# Patient Record
Sex: Male | Born: 1935 | Race: Black or African American | Hispanic: No | Smoking: Former smoker
Health system: Southern US, Community
[De-identification: ages and names within clinical notes are randomized; demographics above are authoritative.]

## PROBLEM LIST (undated history)

## (undated) DIAGNOSIS — R69 Illness, unspecified: Secondary | ICD-10-CM

## (undated) DIAGNOSIS — M8500 Fibrous dysplasia (monostotic), unspecified site: Secondary | ICD-10-CM

## (undated) DIAGNOSIS — I4891 Unspecified atrial fibrillation: Secondary | ICD-10-CM

## (undated) DIAGNOSIS — R0602 Shortness of breath: Secondary | ICD-10-CM

## (undated) DIAGNOSIS — C61 Malignant neoplasm of prostate: Secondary | ICD-10-CM

## (undated) DIAGNOSIS — D472 Monoclonal gammopathy: Secondary | ICD-10-CM

## (undated) DIAGNOSIS — D509 Iron deficiency anemia, unspecified: Secondary | ICD-10-CM

## (undated) DIAGNOSIS — I071 Rheumatic tricuspid insufficiency: Secondary | ICD-10-CM

## (undated) DIAGNOSIS — I5032 Chronic diastolic (congestive) heart failure: Secondary | ICD-10-CM

## (undated) DIAGNOSIS — J439 Emphysema, unspecified: Secondary | ICD-10-CM

## (undated) HISTORY — DX: Illness, unspecified: R69

## (undated) HISTORY — DX: Rheumatic tricuspid insufficiency: I07.1

## (undated) HISTORY — PX: OTHER SURGICAL HISTORY: SHX169

## (undated) HISTORY — DX: Emphysema, unspecified: J43.9

## (undated) HISTORY — DX: Malignant neoplasm of prostate: C61

## (undated) HISTORY — DX: Monoclonal gammopathy: D47.2

## (undated) HISTORY — DX: Fibrous dysplasia (monostotic), unspecified site: M85.00

## (undated) HISTORY — PX: FINGER AMPUTATION: SHX636

## (undated) HISTORY — DX: Iron deficiency anemia, unspecified: D50.9

## (undated) HISTORY — DX: Chronic diastolic (congestive) heart failure: I50.32

## (undated) HISTORY — DX: Unspecified atrial fibrillation: I48.91

---

## 2006-06-17 ENCOUNTER — Emergency Department (HOSPITAL_COMMUNITY): Admission: EM | Admit: 2006-06-17 | Discharge: 2006-06-17 | Payer: Self-pay | Admitting: Emergency Medicine

## 2006-06-18 ENCOUNTER — Inpatient Hospital Stay (HOSPITAL_COMMUNITY): Admission: EM | Admit: 2006-06-18 | Discharge: 2006-06-24 | Payer: Self-pay | Admitting: Emergency Medicine

## 2006-06-23 ENCOUNTER — Encounter: Payer: Self-pay | Admitting: General Surgery

## 2009-01-28 ENCOUNTER — Emergency Department (HOSPITAL_COMMUNITY): Admission: EM | Admit: 2009-01-28 | Discharge: 2009-01-28 | Payer: Self-pay | Admitting: Family Medicine

## 2009-02-05 ENCOUNTER — Emergency Department (HOSPITAL_COMMUNITY): Admission: EM | Admit: 2009-02-05 | Discharge: 2009-02-05 | Payer: Self-pay | Admitting: Family Medicine

## 2009-02-07 ENCOUNTER — Ambulatory Visit (HOSPITAL_COMMUNITY): Admission: RE | Admit: 2009-02-07 | Discharge: 2009-02-07 | Payer: Self-pay | Admitting: Orthopedic Surgery

## 2009-09-10 ENCOUNTER — Encounter: Payer: Self-pay | Admitting: Internal Medicine

## 2009-11-05 ENCOUNTER — Encounter: Payer: Self-pay | Admitting: Internal Medicine

## 2009-11-07 ENCOUNTER — Encounter: Payer: Self-pay | Admitting: Internal Medicine

## 2010-04-29 ENCOUNTER — Emergency Department (HOSPITAL_COMMUNITY): Payer: Medicare Other

## 2010-04-29 ENCOUNTER — Inpatient Hospital Stay (HOSPITAL_COMMUNITY)
Admission: EM | Admit: 2010-04-29 | Discharge: 2010-05-04 | DRG: 189 | Disposition: A | Discharge status: Skilled Nursing Facility | Payer: Medicare Other | Attending: Internal Medicine | Admitting: Internal Medicine

## 2010-04-29 DIAGNOSIS — Z23 Encounter for immunization: Secondary | ICD-10-CM

## 2010-04-29 DIAGNOSIS — I509 Heart failure, unspecified: Secondary | ICD-10-CM | POA: Diagnosis present

## 2010-04-29 DIAGNOSIS — Z87891 Personal history of nicotine dependence: Secondary | ICD-10-CM

## 2010-04-29 DIAGNOSIS — M810 Age-related osteoporosis without current pathological fracture: Secondary | ICD-10-CM | POA: Diagnosis present

## 2010-04-29 DIAGNOSIS — J4489 Other specified chronic obstructive pulmonary disease: Secondary | ICD-10-CM | POA: Diagnosis present

## 2010-04-29 DIAGNOSIS — D509 Iron deficiency anemia, unspecified: Secondary | ICD-10-CM | POA: Diagnosis present

## 2010-04-29 DIAGNOSIS — I2789 Other specified pulmonary heart diseases: Secondary | ICD-10-CM | POA: Diagnosis present

## 2010-04-29 DIAGNOSIS — I5030 Unspecified diastolic (congestive) heart failure: Secondary | ICD-10-CM | POA: Diagnosis present

## 2010-04-29 DIAGNOSIS — D696 Thrombocytopenia, unspecified: Secondary | ICD-10-CM | POA: Diagnosis present

## 2010-04-29 DIAGNOSIS — I080 Rheumatic disorders of both mitral and aortic valves: Secondary | ICD-10-CM | POA: Diagnosis present

## 2010-04-29 DIAGNOSIS — J449 Chronic obstructive pulmonary disease, unspecified: Secondary | ICD-10-CM | POA: Diagnosis present

## 2010-04-29 DIAGNOSIS — Z9981 Dependence on supplemental oxygen: Secondary | ICD-10-CM

## 2010-04-29 DIAGNOSIS — R627 Adult failure to thrive: Secondary | ICD-10-CM | POA: Diagnosis present

## 2010-04-29 DIAGNOSIS — J189 Pneumonia, unspecified organism: Secondary | ICD-10-CM | POA: Diagnosis present

## 2010-04-29 DIAGNOSIS — N400 Enlarged prostate without lower urinary tract symptoms: Secondary | ICD-10-CM | POA: Diagnosis present

## 2010-04-29 DIAGNOSIS — J9620 Acute and chronic respiratory failure, unspecified whether with hypoxia or hypercapnia: Principal | ICD-10-CM | POA: Diagnosis present

## 2010-04-29 DIAGNOSIS — I959 Hypotension, unspecified: Secondary | ICD-10-CM | POA: Diagnosis present

## 2010-04-29 LAB — CBC
HCT: 27.3 % — ABNORMAL LOW (ref 39.0–52.0)
Hemoglobin: 8.5 g/dL — ABNORMAL LOW (ref 13.0–17.0)
MCH: 24.1 pg — ABNORMAL LOW (ref 26.0–34.0)
MCHC: 31.1 g/dL (ref 30.0–36.0)
MCV: 77.6 fL — ABNORMAL LOW (ref 78.0–100.0)
Platelets: 120 10*3/uL — ABNORMAL LOW (ref 150–400)
RBC: 3.52 MIL/uL — ABNORMAL LOW (ref 4.22–5.81)
RDW: 20 % — ABNORMAL HIGH (ref 11.5–15.5)
WBC: 6 10*3/uL (ref 4.0–10.5)

## 2010-04-29 LAB — TSH: TSH: 2.148 u[IU]/mL (ref 0.350–4.500)

## 2010-04-29 LAB — POCT I-STAT, CHEM 8
BUN: 22 mg/dL (ref 6–23)
Creatinine, Ser: 1.2 mg/dL (ref 0.4–1.5)
Glucose, Bld: 86 mg/dL (ref 70–99)
Hemoglobin: 9.5 g/dL — ABNORMAL LOW (ref 13.0–17.0)
Sodium: 144 mEq/L (ref 135–145)
TCO2: 24 mmol/L (ref 0–100)

## 2010-04-29 LAB — DIFFERENTIAL
Lymphocytes Relative: 15 % (ref 12–46)
Lymphs Abs: 0.9 10*3/uL (ref 0.7–4.0)
Monocytes Absolute: 0.3 10*3/uL (ref 0.1–1.0)
Monocytes Relative: 5 % (ref 3–12)
Neutro Abs: 4.7 10*3/uL (ref 1.7–7.7)
Neutrophils Relative %: 78 % — ABNORMAL HIGH (ref 43–77)

## 2010-04-29 LAB — HEPATIC FUNCTION PANEL
ALT: 22 U/L (ref 0–53)
AST: 22 U/L (ref 0–37)
Albumin: 3.3 g/dL — ABNORMAL LOW (ref 3.5–5.2)
Total Protein: 6.7 g/dL (ref 6.0–8.3)

## 2010-04-29 LAB — IRON AND TIBC
Saturation Ratios: 10 % — ABNORMAL LOW (ref 20–55)
UIBC: 167 ug/dL

## 2010-04-29 LAB — POCT CARDIAC MARKERS

## 2010-04-29 LAB — T4, FREE: Free T4: 1.2 ng/dL (ref 0.80–1.80)

## 2010-04-29 LAB — VITAMIN B12: Vitamin B-12: 497 pg/mL (ref 211–911)

## 2010-04-29 LAB — PROTIME-INR: Prothrombin Time: 15.7 seconds — ABNORMAL HIGH (ref 11.6–15.2)

## 2010-04-29 LAB — BRAIN NATRIURETIC PEPTIDE: Pro B Natriuretic peptide (BNP): 206 pg/mL — ABNORMAL HIGH (ref 0.0–100.0)

## 2010-04-29 MED ORDER — IOHEXOL 350 MG/ML SOLN: 100.0000 mL | Freq: Once | INTRAVENOUS | Status: AC | PRN

## 2010-04-30 DIAGNOSIS — I319 Disease of pericardium, unspecified: Secondary | ICD-10-CM

## 2010-04-30 LAB — BASIC METABOLIC PANEL
BUN: 18 mg/dL (ref 6–23)
CO2: 27 mEq/L (ref 19–32)
Calcium: 9.2 mg/dL (ref 8.4–10.5)
GFR calc non Af Amer: >60 mL/min (ref >60)
Glucose, Bld: 94 mg/dL (ref 70–99)

## 2010-04-30 LAB — CBC
HCT: 26.9 % — ABNORMAL LOW (ref 39.0–52.0)
MCHC: 31.2 g/dL (ref 30.0–36.0)
MCV: 78.2 fL (ref 78.0–100.0)
RDW: 20.1 % — ABNORMAL HIGH (ref 11.5–15.5)

## 2010-04-30 LAB — LIPID PANEL
HDL: 28 mg/dL — ABNORMAL LOW (ref >39)
VLDL: 10 mg/dL (ref 0–40)

## 2010-04-30 LAB — MAGNESIUM: Magnesium: 1.9 mg/dL (ref 1.5–2.5)

## 2010-05-01 ENCOUNTER — Inpatient Hospital Stay (HOSPITAL_COMMUNITY): Payer: Medicare Other

## 2010-05-01 LAB — CBC
HCT: 25.4 % — ABNORMAL LOW (ref 39.0–52.0)
Hemoglobin: 8 g/dL — ABNORMAL LOW (ref 13.0–17.0)
RDW: 19.7 % — ABNORMAL HIGH (ref 11.5–15.5)
WBC: 6.5 10*3/uL (ref 4.0–10.5)

## 2010-05-01 LAB — BRAIN NATRIURETIC PEPTIDE: Pro B Natriuretic peptide (BNP): 220 pg/mL — ABNORMAL HIGH (ref 0.0–100.0)

## 2010-05-01 LAB — BASIC METABOLIC PANEL
Calcium: 8.8 mg/dL (ref 8.4–10.5)
GFR calc Af Amer: >60 mL/min (ref >60)
GFR calc non Af Amer: >60 mL/min (ref >60)
Glucose, Bld: 81 mg/dL (ref 70–99)
Potassium: 3.8 mEq/L (ref 3.5–5.1)
Sodium: 140 mEq/L (ref 135–145)

## 2010-05-01 LAB — VITAMIN D 1,25 DIHYDROXY
Vitamin D 1, 25 (OH)2 Total: 60 pg/mL (ref 18–72)
Vitamin D2 1, 25 (OH)2: <8 pg/mL

## 2010-05-02 LAB — BASIC METABOLIC PANEL
BUN: 19 mg/dL (ref 6–23)
CO2: 30 mEq/L (ref 19–32)
Calcium: 8.6 mg/dL (ref 8.4–10.5)
Creatinine, Ser: 0.94 mg/dL (ref 0.4–1.5)
GFR calc non Af Amer: >60 mL/min (ref >60)
Glucose, Bld: 83 mg/dL (ref 70–99)
Sodium: 140 mEq/L (ref 135–145)

## 2010-05-03 LAB — BASIC METABOLIC PANEL
CO2: 31 mEq/L (ref 19–32)
Calcium: 8.6 mg/dL (ref 8.4–10.5)
Chloride: 104 mEq/L (ref 96–112)
Creatinine, Ser: 1.04 mg/dL (ref 0.4–1.5)
GFR calc Af Amer: >60 mL/min (ref >60)
Sodium: 141 mEq/L (ref 135–145)

## 2010-05-03 LAB — CBC
MCH: 24.2 pg — ABNORMAL LOW (ref 26.0–34.0)
MCHC: 31.5 g/dL (ref 30.0–36.0)
Platelets: 120 10*3/uL — ABNORMAL LOW (ref 150–400)
RDW: 19.5 % — ABNORMAL HIGH (ref 11.5–15.5)

## 2010-05-04 LAB — BASIC METABOLIC PANEL
CO2: 30 mEq/L (ref 19–32)
Calcium: 8.7 mg/dL (ref 8.4–10.5)
GFR calc Af Amer: >60 mL/min (ref >60)
GFR calc non Af Amer: >60 mL/min (ref >60)
Potassium: 4.1 mEq/L (ref 3.5–5.1)
Sodium: 140 mEq/L (ref 135–145)

## 2010-05-05 LAB — IGG, IGA, IGM
IgG (Immunoglobin G), Serum: 1370 mg/dL (ref 694–1618)
IgM, Serum: 8 mg/dL — ABNORMAL LOW (ref 60–263)

## 2010-05-05 LAB — PROTEIN ELECTROPH W RFLX QUANT IMMUNOGLOBULINS
Albumin ELP: 47.8 % — ABNORMAL LOW (ref 55.8–66.1)
M-Spike, %: 0.48 g/dL
Total Protein ELP: 6.5 g/dL (ref 6.0–8.3)

## 2010-05-05 NOTE — Discharge Summary (Signed)
NAMEQUINLIN, CONANT               ACCOUNT NO.:  [id number]  MEDICAL RECORD NO.:  [id number]           PATIENT TYPE:  I  LOCATION:  3728                         FACILITY:  [facility]  PHYSICIAN:  Andreas Blower, MD       DATE OF BIRTH:  01/04/1927  DATE OF ADMISSION:  04/29/2010 DATE OF DISCHARGE:                        DISCHARGE SUMMARY - REFERRING   PRIMARY CARE PHYSICIAN:  Dr. Dorene Grebe at [place], [place]  DISCHARGE DIAGNOSES: 1. Acute-on-chronic renal failure. 2. Diastolic heart failure. 3. Pulmonary hypertension. 4. Questionable pneumonia noted on x-ray. 5. Chronic anemia most likely due to iron deficiency. 6. Benign prostatic hypertrophy. 7. History of traumatic pneumothorax. 8. 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 9. Hypertension. 10.History of osteoporosis. 11.Thrombocytopenia.  DISCHARGE MEDICATIONS: 1. Ferrous sulfate 325 mg p.o. daily. 2. Furosemide 40 mg p.o. twice daily. 3. Moxifloxacin 400 mg p.o. daily. 4. Advair 1 puff inhaled twice daily. 5. Combivent 1-2 puffs inhaled every 4-6 hours as needed for shortness     of breath. 6. Finasteride 5 mg p.o. daily. 7. Alendronate 5 mg p.o. q. week.  BRIEF ADMITTING HISTORY AND PHYSICAL:  [name] is an 77 year old African American gentleman who was brought into the ER with shortness of breath that he has been having worsening over the past 2 months.  RADIOLOGY/IMAGING:  The patient had a portable chest x-ray which showed cardiomegaly, pulmonary vascular congestion and probable mild interstitial pulmonary edema, right lower lobe airspace disease, question focal edema versus superimposed pneumonia.  The patient had CT of the chest with contrast which showed no evidence of pulmonary embolism, diffuse ground-glass attenuation, cardiomegaly and bilateral pleural effusions suggest congestive heart failure.  Diffuse expansile lytic lesions throughout  thoracic spine and ribs.  The patient had chest x-ray two view on May 01, 2010, which showed improvement in right lower lobe airspace disease, emphysematous changes noted.  The patient had a 2-D echocardiogram on April 30, 2010, which showed the patient's cavity size was normal.  Wall thickness was increased in the pattern of mild LVH.  Ejection fraction was 55%.  Doppler parameters are consistent with high ventricular filling pressures.  Ventricular septum showed paradox.  Septal motion showed paradox.  Aortic valve showed mild calcified leaflet.  Mitral valve showed mild regurgitation. Left atrium was mildly dilated, right atrium was mildly dilated, tricuspid valve showed moderate regurgitation.  Pulmonary artery systolic pressure was moderately increased with PA peak pressure of 56 mmHg.  LABORATORY DATA:  CBC shows a white count of 5.8, hemoglobin 8.0, hematocrit 25.4, platelet count 120.  Electrolytes normal with creatinine of 0.97.  BNP initially was 206, LDL is 57.  TSH was 2.148, free T4 is 1.2.  Serum iron 18, TIBC 185, percent saturation 100, UIBC 167.  Vitamin B12 497, serum folate 11.4, ferritin 507.  Vitamin D 25- hydroxy was 44, vitamin D 1,25 hydroxy was 60.  HOSPITAL COURSE BY PROBLEM: 1. Acute on chronic respiratory failure multifactorial, likely     secondary to pulmonary hypertension, diastolic heart failure and     questionable pneumonia  improved during the course of hospital stay. 2. Diastolic heart failure.  The patient had his Lasix increased from     20 mg p.o. daily that he takes at home to 20 mg IV b.i.d.  During     the course of hospital stay, the patient had good diuresis.  He had     his weight decreased from 67.5 kg to 61.3 kg at the time of     discharge.  His Lasix was transitioned to 40 mg p.o. twice daily.     The patient will need to have his electrolytes checked on May 08, 2010 to see if his Lasix dose needs to be adjusted.  We  will     defer to his primary care physician to follow up on the labs. 3. Pulmonary hypertension.  The patient had elevated PA pressures as     noted on echocardiogram, consider getting an outpatient pulmonary     evaluation.  We will defer to his primary care physician. 4. Iron-deficiency anemia.  The patient had anemia panel which     showed iron-deficiency anemia.  The patient was started on     supplemental iron. 5. 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  The     patient reports that he has had extensive workup in the past.  He     was evaluated by Dr. Abbe Amsterdam with oncology at [facility] and had     a subsequent referral to [facility] and also has had a bone biopsy     done. 6. Hypotension secondary to diuresis, resolved during the course of     hospital stay. 7. Thrombocytopenia appears to be chronic in nature.  Platelets     remained stable during the course of hospital stay.  Time spent on discharge talking to the patient coordinating care was 35 minutes.     Andreas Blower, MD     SR/MEDQ  D:  05/04/2010  T:  05/04/2010  Job:  [id number]  cc:   Dr. Dorene Grebe  Electronically Signed by Wardell Heath Linnell Swords  on 05/05/2010 10:36:31 PM

## 2010-05-07 LAB — IMMUNOFIXATION ADD-ON
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
## 2010-06-09 ENCOUNTER — Encounter: Payer: Self-pay | Admitting: Internal Medicine

## 2010-06-19 ENCOUNTER — Telehealth: Payer: Self-pay | Admitting: Internal Medicine

## 2010-06-19 NOTE — Telephone Encounter (Signed)
Forwarded to Dr. Norins for review. °

## 2010-06-24 LAB — ANAEROBIC CULTURE

## 2010-06-24 LAB — CULTURE, ROUTINE-ABSCESS

## 2010-06-24 LAB — BASIC METABOLIC PANEL
BUN: 17 mg/dL (ref 6–23)
Calcium: 9 mg/dL (ref 8.4–10.5)
Creatinine, Ser: 1.17 mg/dL (ref 0.4–1.5)
GFR calc Af Amer: >60 mL/min (ref >60)
GFR calc non Af Amer: >60 mL/min (ref >60)

## 2010-06-24 LAB — TISSUE CULTURE

## 2010-06-24 LAB — CBC
Platelets: 161 10*3/uL (ref 150–400)
WBC: 5.6 10*3/uL (ref 4.0–10.5)

## 2010-07-08 ENCOUNTER — Ambulatory Visit (INDEPENDENT_AMBULATORY_CARE_PROVIDER_SITE_OTHER): Payer: PRIVATE HEALTH INSURANCE | Admitting: Internal Medicine

## 2010-07-08 ENCOUNTER — Encounter: Payer: Self-pay | Admitting: Internal Medicine

## 2010-07-08 DIAGNOSIS — I4891 Unspecified atrial fibrillation: Secondary | ICD-10-CM | POA: Insufficient documentation

## 2010-07-08 DIAGNOSIS — I509 Heart failure, unspecified: Secondary | ICD-10-CM

## 2010-07-08 DIAGNOSIS — I5032 Chronic diastolic (congestive) heart failure: Secondary | ICD-10-CM

## 2010-07-08 DIAGNOSIS — J449 Chronic obstructive pulmonary disease, unspecified: Secondary | ICD-10-CM

## 2010-07-08 NOTE — Progress Notes (Signed)
HPI: [name] is a 77 y.o. male Seen to establish cardiac care.  He will was seen by his internist and placed in [place] and found in March to have atrial fibrillation with a rapid ventricular response. He was admitted there. He apparently converted on his own. He was treated with low-dose Cardizem for rate control as he had modest hypotension. He was treated with aspirin for thromboembolic risk reduction. Notably he was not aware of his tachycardia when he was seen by his physician. He had only modest accompanying shortness of breath.  About a month before that he had been admitted to [facility] for significant dyspnea on exertion.  The discharge summary is not available the H&P suggested that he had heart failure as the cause. He was treated with a diuretic improved and was discharged.  Echo cardiogram done at [facility] in March demonstrated normal left ventricular function moderate pulmonary hypertension and known tricuspid and mitral regurgitation.  Thromboembolic risk factors are noted for age-58 hypertension-one  His major complaints today are related to new urinating at night. Functionally he is back to his baseline. He denied chest pain palpitations. His peripheral edema has also resolved. Current Outpatient Prescriptions  Medication Sig Dispense Refill  . albuterol-ipratropium (COMBIVENT) 18-103 MCG/ACT inhaler Inhale 2 puffs into the lungs every 6 (six) hours as needed.        [name] alendronate (FOSAMAX) 70 MG tablet Take 70 mg by mouth every 7 (seven) days. Take with a full glass of water on an empty stomach.       [name] aspirin 81 MG tablet Take 81 mg by mouth daily.        . Calcium Carbonate-Vitamin D (CALCIUM 600+D) 600-400 MG-UNIT per tablet Take 1 tablet by mouth daily.        . Cholecalciferol (VITAMIN D3) 1000 UNITS CAPS Take 1 capsule by mouth daily.        . cyanocobalamin (,VITAMIN B-12,) 1000 MCG/ML injection Inject 1,000 mcg into the muscle once.        . diltiazem (CARDIZEM)  30 MG tablet Take 30 mg by mouth 3 (three) times daily.        . finasteride (PROSCAR) 5 MG tablet Take 5 mg by mouth daily.        . fluticasone-salmeterol (ADVAIR HFA) 115-21 MCG/ACT inhaler Inhale 2 puffs into the lungs 2 (two) times daily.        . furosemide (LASIX) 40 MG tablet Take 40 mg by mouth 2 (two) times daily.        . Tamsulosin HCl (FLOMAX) 0.4 MG CAPS Take 0.4 mg by mouth daily.          No Known Allergies  Past Medical History  Diagnosis Date  . Atrial fibrillation   . COPD (chronic obstructive pulmonary disease)   . Heart failure, diastolic, chronic     EF 55-60% echo 2012  . Diagnosis unknown     lytic lesions-spine-biopsy negative?  . Prostate cancer   . Tricuspid regurgitation     pulmonary hypertension and mitral regurgitation  . Anemia, iron deficiency     History reviewed. No pertinent past surgical history.  Family History  Problem Relation Age of Onset  . Heart attack Father   . Cancer Daughter     History   Social History  . Marital Status: Single    Spouse Name: N/A    Number of Children: N/A  . Years of Education: N/A   Occupational History  .  Not on file.   Social History Main Topics  . Smoking status: Former Smoker    Types: Cigarettes    Quit date: 07/08/2003  . Smokeless tobacco: Never Used  . Alcohol Use: No  . Drug Use: Not on file  . Sexually Active: Not on file   Other Topics Concern  . 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   Cachectic appearing elderly African American male appearing his stated age chewing on a toothpick in  no acute distress HENT normal apart from poor dentition Neck supple with JVP-flat Carotids brisk and full without  bruits Back without scoliosis or kyphosis;Mark barrel chest abnormality Mostly Clear occasional crackles Regular rate and rhythm quite distant sounds  Abd-soft with active BS without hepatomegaly or midline pulsation Femoral pulses 2+ distal pulses intact No Clubbing cyanosis edema Skin-warm and dry LN-neg submandibular and supraclavicular A & Oriented CN 3-12 normal  Grossly normal sensory and motor function Affect engaging . Electrocardiogram demonstrates sinus rhythm at 81 at intervals 0.17/0.11/0.39 Incomplete right bundle branch block with left axis deviation Left ventricular hypertrophy

## 2010-07-08 NOTE — Patient Instructions (Signed)
Your physician recommends that you schedule a follow-up appointment in: 6 MONTHS WITH DR Graciela Husbands   Your physician has recommended you make the following change in your medication:  DECREASE FUROSEMIDE TO 1 TAB DAILY

## 2010-07-08 NOTE — Assessment & Plan Note (Signed)
Patient is much improved. We'll decrease his diuretic from twice daily to once a day.

## 2010-07-08 NOTE — Assessment & Plan Note (Signed)
The patient likely has paroxysmal atrial fibrillation and it will recur. Because he does not feel it, I have instructed him how to take his pulse. He is to let us know in the event that he has recurrence of his atrial fibrillation. I suspect that the diltiazem will be an adequate for rate control. However, right now his blood pressure is borderline and so we will leave it the way it is.   There were reasons it was elected not to put him on Coumadin. I don't know if it's related to his iron deficiency anemia or the lytic lesions in his spine. We'll continue him right now on aspirin.
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
## 2010-08-11 ENCOUNTER — Other Ambulatory Visit (INDEPENDENT_AMBULATORY_CARE_PROVIDER_SITE_OTHER): Payer: PRIVATE HEALTH INSURANCE

## 2010-08-11 ENCOUNTER — Ambulatory Visit (INDEPENDENT_AMBULATORY_CARE_PROVIDER_SITE_OTHER): Payer: PRIVATE HEALTH INSURANCE | Admitting: Internal Medicine

## 2010-08-11 ENCOUNTER — Encounter: Payer: Self-pay | Admitting: Internal Medicine

## 2010-08-11 DIAGNOSIS — D649 Anemia, unspecified: Secondary | ICD-10-CM

## 2010-08-11 DIAGNOSIS — I10 Essential (primary) hypertension: Secondary | ICD-10-CM

## 2010-08-11 DIAGNOSIS — E538 Deficiency of other specified B group vitamins: Secondary | ICD-10-CM

## 2010-08-11 LAB — CBC WITH DIFFERENTIAL/PLATELET
Basophils Absolute: 0 10*3/uL (ref 0.0–0.1)
HCT: 28.3 % — ABNORMAL LOW (ref 39.0–52.0)
Lymphs Abs: 1 10*3/uL (ref 0.7–4.0)
Monocytes Absolute: 0.4 10*3/uL (ref 0.1–1.0)
Monocytes Relative: 6.1 % (ref 3.0–12.0)
Platelets: 159 10*3/uL (ref 150.0–400.0)
RDW: 21 % — ABNORMAL HIGH (ref 11.5–14.6)

## 2010-08-11 LAB — RETICULOCYTES
ABS Retic: 34.9 10*3/uL (ref 19.0–186.0)
RBC.: 3.88 MIL/uL — ABNORMAL LOW (ref 4.22–5.81)
Retic Ct Pct: 0.9 % (ref 0.4–3.1)

## 2010-08-11 LAB — COMPREHENSIVE METABOLIC PANEL
ALT: 11 U/L (ref 0–53)
Alkaline Phosphatase: 54 U/L (ref 39–117)
CO2: 30 mEq/L (ref 19–32)
Sodium: 142 mEq/L (ref 135–145)
Total Bilirubin: 0.5 mg/dL (ref 0.3–1.2)
Total Protein: 6.5 g/dL (ref 6.0–8.3)

## 2010-08-11 LAB — IBC PANEL: Saturation Ratios: 12.7 % — ABNORMAL LOW (ref 20.0–50.0)
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
## 2010-08-27 ENCOUNTER — Encounter: Payer: Self-pay | Admitting: Internal Medicine

## 2010-10-01 ENCOUNTER — Encounter: Payer: Self-pay | Admitting: Internal Medicine

## 2010-10-01 NOTE — Telephone Encounter (Signed)
error 

## 2010-10-30 ENCOUNTER — Other Ambulatory Visit: Payer: Self-pay | Admitting: Internal Medicine

## 2010-10-30 ENCOUNTER — Other Ambulatory Visit: Payer: Self-pay | Admitting: *Deleted

## 2010-10-30 MED ORDER — FINASTERIDE 5 MG PO TABS: 5.0000 mg | ORAL_TABLET | Freq: Every day | ORAL | Status: DC

## 2010-10-30 MED ORDER — TAMSULOSIN HCL 0.4 MG PO CAPS: 0.4000 mg | ORAL_CAPSULE | Freq: Every day | ORAL | Status: DC

## 2010-11-26 ENCOUNTER — Other Ambulatory Visit: Payer: Self-pay | Admitting: Internal Medicine

## 2010-11-26 NOTE — Telephone Encounter (Signed)
Pt's grand child called and stated his lasix and Diltiazem had been denied for refill. Pt has been out for one week.  Please call and advise what he should do?    [phone] Scharlene Corn.

## 2010-11-30 ENCOUNTER — Other Ambulatory Visit: Payer: Self-pay | Admitting: *Deleted

## 2010-11-30 DIAGNOSIS — I5032 Chronic diastolic (congestive) heart failure: Secondary | ICD-10-CM

## 2010-11-30 MED ORDER — FUROSEMIDE 40 MG PO TABS: 40.0000 mg | ORAL_TABLET | Freq: Two times a day (BID) | ORAL | Status: DC

## 2010-11-30 NOTE — Telephone Encounter (Signed)
Pt grandson calling regarding pt RX. Please call back.

## 2010-11-30 NOTE — Telephone Encounter (Signed)
Pt grandson calling regarding pt RX. Pt has not had his heart medication for 2 wks. Pt was denied refill of diltiazem and lasix. Please return call to discuss further.  Pt was told msg was sent around as high priority considering it wasn't addressed last week.

## 2010-12-01 MED ORDER — FUROSEMIDE 40 MG PO TABS: 40.0000 mg | ORAL_TABLET | Freq: Every day | ORAL | Status: DC

## 2010-12-01 MED ORDER — DILTIAZEM HCL 30 MG PO TABS: 30.0000 mg | ORAL_TABLET | Freq: Three times a day (TID) | ORAL | Status: DC

## 2010-12-03 ENCOUNTER — Telehealth: Payer: Self-pay | Admitting: *Deleted

## 2010-12-03 NOTE — Telephone Encounter (Signed)
RN from [employer] called patient for monthly check. She was worried about patient's "wet" productive cough w/green mucus x 2 wks. Pt denies fever, chills, body aches, chest tightness and/or wheezing. I called to talk w/patient and offer OV for eval. He feels "good" and does not want OV. I advised him to come in next week if cough had not improved or any time symptoms became worse, he agreed.

## 2010-12-03 NOTE — Telephone Encounter (Signed)
We offered. Tis all we can do.

## 2010-12-22 ENCOUNTER — Other Ambulatory Visit: Payer: Self-pay | Admitting: *Deleted

## 2010-12-22 MED ORDER — IPRATROPIUM-ALBUTEROL 18-103 MCG/ACT IN AERO: 2.0000 | INHALATION_SPRAY | Freq: Four times a day (QID) | RESPIRATORY_TRACT | Status: DC | PRN

## 2010-12-22 MED ORDER — ALENDRONATE SODIUM 70 MG PO TABS: 70.0000 mg | ORAL_TABLET | ORAL | Status: DC

## 2010-12-22 MED ORDER — FLUTICASONE-SALMETEROL 115-21 MCG/ACT IN AERO: 2.0000 | INHALATION_SPRAY | Freq: Two times a day (BID) | RESPIRATORY_TRACT | Status: DC

## 2010-12-29 ENCOUNTER — Ambulatory Visit (INDEPENDENT_AMBULATORY_CARE_PROVIDER_SITE_OTHER): Payer: Medicaid Other | Admitting: Internal Medicine

## 2010-12-29 ENCOUNTER — Encounter: Payer: Self-pay | Admitting: Internal Medicine

## 2010-12-29 VITALS — BP 102/48 | HR 78 | Ht 68.0 in | Wt 138.1 lb

## 2010-12-29 DIAGNOSIS — I509 Heart failure, unspecified: Secondary | ICD-10-CM

## 2010-12-29 DIAGNOSIS — I4891 Unspecified atrial fibrillation: Secondary | ICD-10-CM

## 2010-12-29 DIAGNOSIS — I5032 Chronic diastolic (congestive) heart failure: Secondary | ICD-10-CM

## 2010-12-29 NOTE — Assessment & Plan Note (Signed)
He is holding sinus rhythm. When he sees Dr. Debby Bud, simplifying his regime with daily Cardizem may make sense.

## 2010-12-29 NOTE — Assessment & Plan Note (Signed)
Stable. We'll continue current medications 

## 2010-12-29 NOTE — Patient Instructions (Signed)
Your physician wants you to follow-up in: 1 year. You will receive a reminder letter in the mail two months in advance. If you don't receive a letter, please call our office to schedule the follow-up appointment.  
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
## 2010-12-30 ENCOUNTER — Telehealth: Payer: Self-pay | Admitting: Internal Medicine

## 2010-12-30 NOTE — Telephone Encounter (Signed)
lmom for [name] that his Furosemide dose as of 12/29/10 note is 40mg  twice daily

## 2010-12-30 NOTE — Telephone Encounter (Signed)
[facility] has called and is concerned about his lasix please clarify dosage

## 2010-12-31 ENCOUNTER — Ambulatory Visit (INDEPENDENT_AMBULATORY_CARE_PROVIDER_SITE_OTHER): Payer: PRIVATE HEALTH INSURANCE | Admitting: Internal Medicine

## 2010-12-31 DIAGNOSIS — N400 Enlarged prostate without lower urinary tract symptoms: Secondary | ICD-10-CM

## 2010-12-31 DIAGNOSIS — I5032 Chronic diastolic (congestive) heart failure: Secondary | ICD-10-CM

## 2010-12-31 DIAGNOSIS — I509 Heart failure, unspecified: Secondary | ICD-10-CM

## 2010-12-31 DIAGNOSIS — I4891 Unspecified atrial fibrillation: Secondary | ICD-10-CM

## 2010-12-31 DIAGNOSIS — J449 Chronic obstructive pulmonary disease, unspecified: Secondary | ICD-10-CM

## 2010-12-31 DIAGNOSIS — J4489 Other specified chronic obstructive pulmonary disease: Secondary | ICD-10-CM

## 2010-12-31 DIAGNOSIS — D649 Anemia, unspecified: Secondary | ICD-10-CM

## 2010-12-31 DIAGNOSIS — I10 Essential (primary) hypertension: Secondary | ICD-10-CM

## 2010-12-31 MED ORDER — GABAPENTIN 300 MG PO CAPS: ORAL_CAPSULE | ORAL | Status: DC

## 2010-12-31 MED ORDER — DILTIAZEM HCL ER COATED BEADS 120 MG PO CP24: 120.0000 mg | ORAL_CAPSULE | Freq: Every day | ORAL | Status: DC

## 2010-12-31 NOTE — Patient Instructions (Signed)
Heart - good report from Dr. Graciela Husbands. Irregular rhythm and heart failure well controlled. Will change diltiazem to a long acting form you will take once a day for control of heart rate.  Eye- get them checked soon - follow up of glaucoma  Blood pressure - a little low. We will need to keep an eye on the readings.   Anemia and B12 deficiency - please have Dr. Abbe Amsterdam send me copies of labs and office notes.   Diet - need a few pounds winter weight.   Please come back to see me in 3-4 months.   Sore finger - take gabapentin 300 mg at bedtime. We can increase this medicine every several weeks to get to a dose that work.
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
## 2011-01-03 DIAGNOSIS — N400 Enlarged prostate without lower urinary tract symptoms: Secondary | ICD-10-CM | POA: Insufficient documentation

## 2011-01-03 NOTE — Assessment & Plan Note (Signed)
Stable on current medication with no complaint of symptoms

## 2011-01-03 NOTE — Assessment & Plan Note (Signed)
BP Readings from Last 3 Encounters:  12/31/10 100/58  12/29/10 102/48  08/11/10 100/56   BP running a bit low but consistent, tolerates it well. His CCB and furosemide will be continued for mangement of CHF and A. Fibrillation.

## 2011-01-03 NOTE — Assessment & Plan Note (Signed)
Followed by cardiology - last note reviewed. He is stable.

## 2011-01-03 NOTE — Assessment & Plan Note (Addendum)
Last lab - Hgb 9.4 in May '12. He has a h/o of expansile lytic lesions - see Feb '12 hospital d/c summary  Plan - he seems to be stable.            Followed by Heme/onc in [place].

## 2011-01-03 NOTE — Assessment & Plan Note (Signed)
At today's visit no increased work of breathing, good breath sounds. Appears to be stable.  Plan - continue combivent, advair

## 2011-01-03 NOTE — Assessment & Plan Note (Signed)
NO evidence of decompensation on today's exam.  Plan - continue present regimen and follow-up with cardiology as instructed.

## 2011-01-19 ENCOUNTER — Telehealth: Payer: Self-pay | Admitting: *Deleted

## 2011-01-19 NOTE — Telephone Encounter (Signed)
RN from [facility] heart failure program called. Patient is part of the heart failure program. He is weighed daily and answers questions on the electronic scale. Pt gained 5 lbs in one day and c/o SOB. RN said she was unable to contact patient. I left pt Vm to call office back. Also tried his cell # - he answered but asked that I call back in 30 min or so when he got home b/c he was driving.

## 2011-01-20 NOTE — Telephone Encounter (Signed)
Left mess to call office back around 12 today

## 2011-01-22 NOTE — Telephone Encounter (Signed)
I have called pt several times and left messages. I have yet received a call from Pt

## 2011-01-28 NOTE — Telephone Encounter (Signed)
I spoke with pt, he states he is doing just fine and is being watched closely with the heart failure program. Pt states his weight is under control and will call office prn

## 2011-02-05 ENCOUNTER — Telehealth: Payer: Self-pay

## 2011-02-05 NOTE — Telephone Encounter (Signed)
RN w/ [facility] called stating that pt is part of the heart failure program and she needed to report a weight gain of 8 lb in just one day. I returned call to nurse at [phone] ext [phone] who was not available//LMOVM for her to call back. I called pt how states that he has been taking lasix as directed BID, no SOB, no fatigue, only some L foot swelling. Patient said he feels fine. I spoke with Dr. Posey Rea who advised pt to be seen by Saturday clinic. I returned call to patient and advised per MD to be evaluated by sat clinic MD. Per pt he feels fine and will not be able to come by because he has to have his eyes checked for eyeglass fittings. Patient was informed to call our ON call MD on report to ER if any symptom change or swelling.

## 2011-03-18 ENCOUNTER — Telehealth: Payer: Self-pay | Admitting: Internal Medicine

## 2011-03-18 NOTE — Telephone Encounter (Signed)
New Problem:    Patient's daughter called because [facility] called wanting to know what prescription strength of diltiazem (CARDIZEM CD) 120 MG 24 hr capsule he is using. Please fee free to leave a message.

## 2011-03-18 NOTE — Telephone Encounter (Signed)
I left a message for the patient or his daughter to call. I do not quite understand the message that was taken. I need clarification on what is needed.

## 2011-03-24 NOTE — Telephone Encounter (Signed)
I spoke with the patient. He states that [facility] contacted him because he had prescriptions for both Diltiazem 30mg  (instructions were to take one TID). He also had Diltiazem 120mg  once daily. The patient stated that [facility] told him that the long acting Diltiazem at 120mg  once daily was too strong for him and he should drop back to his regular dose. I explained to the patient that in reviewing his chart, that Dr. Debby Bud changed his dose on 12/31/10 to Diltiazem CD 120mg  once daily to simplify his regimen. I advised that he should take the 120mg  capsule of Diltiazem once daily. I also advised that if [facility] calls him and advises a med change, that he should tell them he needs to review this with his doctor. The patient was wanting to know about follow up appointments as well. I explained he should see [place] back in October of 2013. Per Dr. Debby Bud note from 12/2010, he wanted to see him back in 3-4 months. I advised that the patient call to make a follow up appointment for this month or next. He is agreeable and voices understanding.

## 2011-05-17 ENCOUNTER — Other Ambulatory Visit: Payer: Self-pay | Admitting: Internal Medicine

## 2011-05-18 NOTE — Telephone Encounter (Signed)
Done

## 2011-06-14 ENCOUNTER — Other Ambulatory Visit: Payer: Self-pay | Admitting: Internal Medicine

## 2011-06-25 ENCOUNTER — Inpatient Hospital Stay (HOSPITAL_COMMUNITY)
Admission: EM | Admit: 2011-06-25 | Discharge: 2011-06-29 | DRG: 191 | Disposition: A | Discharge status: Home / Self Care | Payer: PRIVATE HEALTH INSURANCE | Attending: Internal Medicine | Admitting: Internal Medicine

## 2011-06-25 ENCOUNTER — Other Ambulatory Visit: Payer: Self-pay

## 2011-06-25 ENCOUNTER — Encounter (HOSPITAL_COMMUNITY): Payer: Self-pay | Admitting: *Deleted

## 2011-06-25 ENCOUNTER — Emergency Department (HOSPITAL_COMMUNITY): Payer: PRIVATE HEALTH INSURANCE

## 2011-06-25 DIAGNOSIS — I5032 Chronic diastolic (congestive) heart failure: Secondary | ICD-10-CM | POA: Diagnosis present

## 2011-06-25 DIAGNOSIS — D696 Thrombocytopenia, unspecified: Secondary | ICD-10-CM | POA: Diagnosis present

## 2011-06-25 DIAGNOSIS — J441 Chronic obstructive pulmonary disease with (acute) exacerbation: Principal | ICD-10-CM

## 2011-06-25 DIAGNOSIS — I452 Bifascicular block: Secondary | ICD-10-CM | POA: Diagnosis present

## 2011-06-25 DIAGNOSIS — I509 Heart failure, unspecified: Secondary | ICD-10-CM | POA: Diagnosis present

## 2011-06-25 DIAGNOSIS — R0902 Hypoxemia: Secondary | ICD-10-CM

## 2011-06-25 DIAGNOSIS — I059 Rheumatic mitral valve disease, unspecified: Secondary | ICD-10-CM | POA: Diagnosis present

## 2011-06-25 DIAGNOSIS — Z7982 Long term (current) use of aspirin: Secondary | ICD-10-CM

## 2011-06-25 DIAGNOSIS — J449 Chronic obstructive pulmonary disease, unspecified: Secondary | ICD-10-CM

## 2011-06-25 DIAGNOSIS — D649 Anemia, unspecified: Secondary | ICD-10-CM | POA: Diagnosis present

## 2011-06-25 DIAGNOSIS — Z79899 Other long term (current) drug therapy: Secondary | ICD-10-CM

## 2011-06-25 DIAGNOSIS — E876 Hypokalemia: Secondary | ICD-10-CM | POA: Diagnosis present

## 2011-06-25 DIAGNOSIS — I2789 Other specified pulmonary heart diseases: Secondary | ICD-10-CM | POA: Diagnosis present

## 2011-06-25 DIAGNOSIS — I4891 Unspecified atrial fibrillation: Secondary | ICD-10-CM | POA: Diagnosis present

## 2011-06-25 DIAGNOSIS — M899 Disorder of bone, unspecified: Secondary | ICD-10-CM | POA: Diagnosis present

## 2011-06-25 DIAGNOSIS — I10 Essential (primary) hypertension: Secondary | ICD-10-CM | POA: Diagnosis present

## 2011-06-25 DIAGNOSIS — Z87891 Personal history of nicotine dependence: Secondary | ICD-10-CM

## 2011-06-25 DIAGNOSIS — C61 Malignant neoplasm of prostate: Secondary | ICD-10-CM | POA: Diagnosis present

## 2011-06-25 LAB — DIFFERENTIAL
Basophils Absolute: 0.1 10*3/uL (ref 0.0–0.1)
Eosinophils Absolute: 0.2 10*3/uL (ref 0.0–0.7)
Lymphs Abs: 1 10*3/uL (ref 0.7–4.0)
Monocytes Absolute: 0.4 10*3/uL (ref 0.1–1.0)
Monocytes Relative: 8 % (ref 3–12)

## 2011-06-25 LAB — CBC
MCV: 78.8 fL (ref 78.0–100.0)
Platelets: 136 10*3/uL — ABNORMAL LOW (ref 150–400)
RDW: 20.2 % — ABNORMAL HIGH (ref 11.5–15.5)
WBC: 5.2 10*3/uL (ref 4.0–10.5)

## 2011-06-25 LAB — COMPREHENSIVE METABOLIC PANEL
BUN: 14 mg/dL (ref 6–23)
Calcium: 8.8 mg/dL (ref 8.4–10.5)
Creatinine, Ser: 0.75 mg/dL (ref 0.50–1.35)
GFR calc Af Amer: >90 mL/min (ref >90)
Glucose, Bld: 82 mg/dL (ref 70–99)
Sodium: 141 mEq/L (ref 135–145)
Total Protein: 6.8 g/dL (ref 6.0–8.3)

## 2011-06-25 LAB — POCT I-STAT TROPONIN I: Troponin i, poc: 0.01 ng/mL (ref 0.00–0.08)

## 2011-06-25 MED ORDER — GABAPENTIN 300 MG PO CAPS
300.0000 mg | ORAL_CAPSULE | Freq: Every day | ORAL | Status: DC
  Administered 2011-06-25 – 2011-06-28 (×4): 300 mg via ORAL
  Filled 2011-06-25 (×5): qty 1

## 2011-06-25 MED ORDER — ALBUTEROL SULFATE (5 MG/ML) 0.5% IN NEBU
2.5000 mg | INHALATION_SOLUTION | Freq: Four times a day (QID) | RESPIRATORY_TRACT | Status: DC
  Administered 2011-06-25 – 2011-06-28 (×12): 2.5 mg via RESPIRATORY_TRACT
  Filled 2011-06-25 (×12): qty 0.5

## 2011-06-25 MED ORDER — ACETAMINOPHEN 650 MG RE SUPP: 650.0000 mg | Freq: Four times a day (QID) | RECTAL | Status: DC | PRN

## 2011-06-25 MED ORDER — ALBUTEROL SULFATE (5 MG/ML) 0.5% IN NEBU
2.5000 mg | INHALATION_SOLUTION | Freq: Once | RESPIRATORY_TRACT | Status: AC
  Administered 2011-06-25: 2.5 mg via RESPIRATORY_TRACT
  Filled 2011-06-25: qty 0.5

## 2011-06-25 MED ORDER — ACETAMINOPHEN 325 MG PO TABS: 650.0000 mg | ORAL_TABLET | Freq: Four times a day (QID) | ORAL | Status: DC | PRN

## 2011-06-25 MED ORDER — ENOXAPARIN SODIUM 40 MG/0.4ML ~~LOC~~ SOLN
40.0000 mg | SUBCUTANEOUS | Status: DC
  Administered 2011-06-25 – 2011-06-28 (×4): 40 mg via SUBCUTANEOUS
  Filled 2011-06-25 (×5): qty 0.4

## 2011-06-25 MED ORDER — ONDANSETRON HCL 4 MG PO TABS: 4.0000 mg | ORAL_TABLET | Freq: Four times a day (QID) | ORAL | Status: DC | PRN

## 2011-06-25 MED ORDER — VITAMIN D3 25 MCG (1000 UT) PO CAPS: 1.0000 | ORAL_CAPSULE | Freq: Every day | ORAL | Status: DC

## 2011-06-25 MED ORDER — ALBUTEROL SULFATE (5 MG/ML) 0.5% IN NEBU
2.5000 mg | INHALATION_SOLUTION | RESPIRATORY_TRACT | Status: DC | PRN
  Administered 2011-06-29: 2.5 mg via RESPIRATORY_TRACT
  Filled 2011-06-25: qty 0.5

## 2011-06-25 MED ORDER — ALBUTEROL SULFATE (5 MG/ML) 0.5% IN NEBU
2.5000 mg | INHALATION_SOLUTION | Freq: Once | RESPIRATORY_TRACT | Status: AC
  Administered 2011-06-25: 2.5 mg via RESPIRATORY_TRACT
  Filled 2011-06-25: qty 1

## 2011-06-25 MED ORDER — ASPIRIN EC 81 MG PO TBEC
81.0000 mg | DELAYED_RELEASE_TABLET | Freq: Every day | ORAL | Status: DC
  Administered 2011-06-26 – 2011-06-28 (×3): 81 mg via ORAL
  Filled 2011-06-25 (×4): qty 1

## 2011-06-25 MED ORDER — VITAMIN D3 25 MCG (1000 UNIT) PO TABS
1000.0000 [IU] | ORAL_TABLET | Freq: Every day | ORAL | Status: DC
  Administered 2011-06-26 – 2011-06-28 (×3): 1000 [IU] via ORAL
  Filled 2011-06-25 (×4): qty 1

## 2011-06-25 MED ORDER — POTASSIUM CHLORIDE CRYS ER 20 MEQ PO TBCR
40.0000 meq | EXTENDED_RELEASE_TABLET | Freq: Once | ORAL | Status: AC
  Administered 2011-06-25: 40 meq via ORAL
  Filled 2011-06-25: qty 2

## 2011-06-25 MED ORDER — FUROSEMIDE 40 MG PO TABS
40.0000 mg | ORAL_TABLET | Freq: Two times a day (BID) | ORAL | Status: DC
  Administered 2011-06-25 – 2011-06-28 (×7): 40 mg via ORAL
  Filled 2011-06-25 (×9): qty 1

## 2011-06-25 MED ORDER — DOXYCYCLINE HYCLATE 100 MG PO TABS
100.0000 mg | ORAL_TABLET | Freq: Two times a day (BID) | ORAL | Status: DC
  Administered 2011-06-25 – 2011-06-28 (×7): 100 mg via ORAL
  Filled 2011-06-25 (×9): qty 1

## 2011-06-25 MED ORDER — CALCIUM CARBONATE-VITAMIN D 500-200 MG-UNIT PO TABS
1.0000 | ORAL_TABLET | Freq: Every day | ORAL | Status: DC
  Administered 2011-06-26 – 2011-06-28 (×3): 1 via ORAL
  Filled 2011-06-25 (×4): qty 1

## 2011-06-25 MED ORDER — DEXTROSE 5 % IV SOLN
1.0000 g | INTRAVENOUS | Status: DC
  Administered 2011-06-25: 1 g via INTRAVENOUS
  Filled 2011-06-25 (×2): qty 10

## 2011-06-25 MED ORDER — IPRATROPIUM BROMIDE 0.02 % IN SOLN
0.5000 mg | Freq: Once | RESPIRATORY_TRACT | Status: AC
  Administered 2011-06-25: 0.5 mg via RESPIRATORY_TRACT
  Filled 2011-06-25: qty 2.5

## 2011-06-25 MED ORDER — CALCIUM CARBONATE-VITAMIN D 600-400 MG-UNIT PO TABS: 1.0000 | ORAL_TABLET | Freq: Every day | ORAL | Status: DC

## 2011-06-25 MED ORDER — FINASTERIDE 5 MG PO TABS
5.0000 mg | ORAL_TABLET | Freq: Every day | ORAL | Status: DC
  Administered 2011-06-25 – 2011-06-28 (×4): 5 mg via ORAL
  Filled 2011-06-25 (×5): qty 1

## 2011-06-25 MED ORDER — IPRATROPIUM BROMIDE 0.02 % IN SOLN
0.5000 mg | Freq: Four times a day (QID) | RESPIRATORY_TRACT | Status: DC
  Administered 2011-06-25 – 2011-06-28 (×12): 0.5 mg via RESPIRATORY_TRACT
  Filled 2011-06-25 (×12): qty 2.5

## 2011-06-25 MED ORDER — SODIUM CHLORIDE 0.9 % IJ SOLN
3.0000 mL | Freq: Two times a day (BID) | INTRAMUSCULAR | Status: DC
  Administered 2011-06-25 – 2011-06-28 (×6): 3 mL via INTRAVENOUS

## 2011-06-25 MED ORDER — DILTIAZEM HCL 30 MG PO TABS
30.0000 mg | ORAL_TABLET | Freq: Three times a day (TID) | ORAL | Status: DC
  Administered 2011-06-25 – 2011-06-29 (×9): 30 mg via ORAL
  Filled 2011-06-25 (×14): qty 1

## 2011-06-25 MED ORDER — METHYLPREDNISOLONE SODIUM SUCC 125 MG IJ SOLR
60.0000 mg | Freq: Four times a day (QID) | INTRAMUSCULAR | Status: DC
  Administered 2011-06-25: 21:00:00 via INTRAVENOUS
  Administered 2011-06-26: 60 mg via INTRAVENOUS
  Administered 2011-06-26: 03:00:00 via INTRAVENOUS
  Filled 2011-06-25 (×6): qty 0.96

## 2011-06-25 MED ORDER — ALBUTEROL SULFATE (5 MG/ML) 0.5% IN NEBU: 2.5000 mg | INHALATION_SOLUTION | RESPIRATORY_TRACT | Status: DC | PRN

## 2011-06-25 MED ORDER — TAMSULOSIN HCL 0.4 MG PO CAPS
0.4000 mg | ORAL_CAPSULE | Freq: Every day | ORAL | Status: DC
  Administered 2011-06-25 – 2011-06-28 (×4): 0.4 mg via ORAL
  Filled 2011-06-25 (×5): qty 1

## 2011-06-25 MED ORDER — METHYLPREDNISOLONE SODIUM SUCC 125 MG IJ SOLR
125.0000 mg | Freq: Once | INTRAMUSCULAR | Status: AC
  Administered 2011-06-25: 125 mg via INTRAVENOUS
  Filled 2011-06-25 (×2): qty 2

## 2011-06-25 MED ORDER — GUAIFENESIN ER 600 MG PO TB12
600.0000 mg | ORAL_TABLET | Freq: Two times a day (BID) | ORAL | Status: DC
  Administered 2011-06-25 – 2011-06-28 (×7): 600 mg via ORAL
  Filled 2011-06-25 (×11): qty 1

## 2011-06-25 MED ORDER — ONDANSETRON HCL 4 MG/2ML IJ SOLN: 4.0000 mg | Freq: Four times a day (QID) | INTRAMUSCULAR | Status: DC | PRN

## 2011-06-25 NOTE — Plan of Care (Signed)
Problem: Phase I Progression Outcomes Goal: Flu/PneumoVaccines if indicated Outcome: Not Applicable Date Met:  06/25/11 Current on immunizations
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
## 2011-06-25 NOTE — ED Notes (Signed)
Pt given crackers with potassium med admin. Was able to swallow without any conflict. Currently on room air with saturations at 91 and 88-90 when talking.
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
## 2011-06-25 NOTE — ED Notes (Signed)
Pt from home. inreased SOB x3 days, worse with exertion. No O2 at home. Hx of CHF. Course crackles and rhonchi per ems. Unable to complete sentences upon ems arrival. EMS reports some relief from CPAP. 85% on RA, 100% on CPAP. Received 30mg  Lasix IVP en route.

## 2011-06-26 DIAGNOSIS — J449 Chronic obstructive pulmonary disease, unspecified: Secondary | ICD-10-CM

## 2011-06-26 LAB — CBC
Platelets: 151 10*3/uL (ref 150–400)
RBC: 4.3 MIL/uL (ref 4.22–5.81)
RDW: 20.2 % — ABNORMAL HIGH (ref 11.5–15.5)
WBC: 5.2 10*3/uL (ref 4.0–10.5)

## 2011-06-26 LAB — BASIC METABOLIC PANEL
Calcium: 9.1 mg/dL (ref 8.4–10.5)
GFR calc non Af Amer: 84 mL/min — ABNORMAL LOW (ref >90)
Sodium: 137 mEq/L (ref 135–145)

## 2011-06-26 MED ORDER — METHYLPREDNISOLONE SODIUM SUCC 125 MG IJ SOLR
80.0000 mg | Freq: Three times a day (TID) | INTRAMUSCULAR | Status: DC
  Administered 2011-06-26 – 2011-06-27 (×3): 80 mg via INTRAVENOUS
  Filled 2011-06-26 (×6): qty 1.28

## 2011-06-26 NOTE — Progress Notes (Signed)
  Subjective: Patient admitted with exacerbation of COPD with hypoxemia, increased work of breathing. No infiltrate or evidence of frank infection. No chest pain. This AM he is doing a little better but still short of breath. No chest pain or discomfort   Objective: Lab: Lab Results  Component Value Date   WBC 5.2 06/26/2011   HGB 10.5* 06/26/2011   HCT 33.6* 06/26/2011   MCV 78.1 06/26/2011   PLT 151 06/26/2011   BMET    Component Value Date/Time   NA 137 06/26/2011 0605   K 4.0 06/26/2011 0605   CL 102 06/26/2011 0605   CO2 28 06/26/2011 0605   GLUCOSE 223* 06/26/2011 0605   BUN 17 06/26/2011 0605   CREATININE 0.73 06/26/2011 0605   CALCIUM 9.1 06/26/2011 0605   GFRNONAA 84* 06/26/2011 0605   GFRAA >90 06/26/2011 0605    BNP    Component Value Date/Time   PROBNP 256.8 06/25/2011 1035     Imaging: no new imaging   Physical Exam: Filed Vitals:   06/26/11 0814  BP: 129/62  Pulse: 73  Temp: 98.6 F (37 C)  Resp: 20   Elderly AA man in no distress HEENT - temporal wasting Chest - prolonged expiratory phase, diffuse inspiratory and expiratory wheezing, no rales Cor - IRIR rate controlled Neuro - A&O x 3     Assessment/Plan: 1. Pulm - exacerbation of COPD. No pneumonia, no leukocytosis Plan - continue solumedrol at 80 mig IV q 8            D/C rocephin            Continue doxycyline            Continue albuterol nebs, spiriva            Oxygen at 2 l  2. Cardiac - stable a. Fib. No evidence of decompensated heart failure with minimal elevation in pBNP Plan - d/c telemetry              3. F/E/N - stable Plan - f/u [facility]    Illene Regulus 06/26/2011, 9:54 AM

## 2011-06-27 LAB — BASIC METABOLIC PANEL
Chloride: 101 mEq/L (ref 96–112)
GFR calc Af Amer: >90 mL/min (ref >90)
GFR calc non Af Amer: 81 mL/min — ABNORMAL LOW (ref >90)
Potassium: 4.1 mEq/L (ref 3.5–5.1)
Sodium: 137 mEq/L (ref 135–145)

## 2011-06-27 LAB — CBC
Hemoglobin: 10.7 g/dL — ABNORMAL LOW (ref 13.0–17.0)
RBC: 4.37 MIL/uL (ref 4.22–5.81)
WBC: 7.7 10*3/uL (ref 4.0–10.5)

## 2011-06-27 MED ORDER — METHYLPREDNISOLONE SODIUM SUCC 125 MG IJ SOLR
80.0000 mg | Freq: Two times a day (BID) | INTRAMUSCULAR | Status: DC
  Administered 2011-06-27: 80 mg via INTRAVENOUS
  Filled 2011-06-27 (×4): qty 1.28

## 2011-06-27 NOTE — Progress Notes (Signed)
Subjective: Sitting up on the side of the bed. Has eaten a good lunch. He feels better - less work to breath  Objective: Lab: Lab Results  Component Value Date   WBC 7.7 06/27/2011   HGB 10.7* 06/27/2011   HCT 34.6* 06/27/2011   MCV 79.2 06/27/2011   PLT 156 06/27/2011   BMET    Component Value Date/Time   NA 137 06/27/2011 0529   K 4.1 06/27/2011 0529   CL 101 06/27/2011 0529   CO2 29 06/27/2011 0529   GLUCOSE 146* 06/27/2011 0529   BUN 21 06/27/2011 0529   CREATININE 0.80 06/27/2011 0529   CALCIUM 9.5 06/27/2011 0529   GFRNONAA 81* 06/27/2011 0529   GFRAA >90 06/27/2011 0529      Imaging: no new imaging   Physical Exam: Filed Vitals:   06/27/11 0557  BP: 139/51  Pulse: 79  Temp: 97.7 F (36.5 C)  Resp: 20    Intake/Output Summary (Last 24 hours) at 06/27/11 1146 Last data filed at 06/27/11 1100  Gross per 24 hour  Intake    360 ml  Output   1050 ml  Net   -690 ml   Gen'l - very thin AA man in no distress Cor - IRIR rate controlled Pulm - prolonged expiratory phase             End expiratory wheeze - feint.     Assessment/Plan: 1. Pulm - exacerbation of COPD #3 doxycycline. #3 high dose IV steroids - 80 mg solumedrol q 8. Plan - decrease to 80 mg q12  2. Cardiac - stable  3. F/E/N K 4.1   Illene Regulus 06/27/2011, 11:45 AM

## 2011-06-28 ENCOUNTER — Telehealth: Payer: Self-pay | Admitting: *Deleted

## 2011-06-28 DIAGNOSIS — J441 Chronic obstructive pulmonary disease with (acute) exacerbation: Secondary | ICD-10-CM

## 2011-06-28 MED ORDER — PREDNISONE 20 MG PO TABS
30.0000 mg | ORAL_TABLET | Freq: Two times a day (BID) | ORAL | Status: DC
  Administered 2011-06-28 – 2011-06-29 (×3): 30 mg via ORAL
  Filled 2011-06-28 (×5): qty 1

## 2011-06-28 MED ORDER — ALENDRONATE SODIUM 10 MG PO TABS: 70.0000 mg | ORAL_TABLET | ORAL | Status: DC

## 2011-06-28 MED ORDER — ENSURE COMPLETE PO LIQD
237.0000 mL | Freq: Two times a day (BID) | ORAL | Status: DC
  Administered 2011-06-28: 237 mL via ORAL

## 2011-06-28 NOTE — Telephone Encounter (Signed)
[facility] called to request reorder for patient's [room 1501] Fosamax that he takes every Monday; also, family request referral for Home Health, as patient lives alone. POx [on room air] requested: Rest 98% & AMB via hallway 92% [lowest].

## 2011-06-28 NOTE — Progress Notes (Signed)
Nutrition Brief Note  - Pt screened for unintentional weight loss of 10 pounds in the past month, however pt states his usual weight is 131 pounds and admission weight is 130 pounds. Pt states his weight fluctuates r/t CHF. Pt on low sodium diet with 2 Ensure/day at home. Pt reports not eating well for a few days PTA r/t poor appetite but it has improved since admission. Pt reports excellent intake during admission, 100% of meals, which is what RN has been charting for intake. Pt denies any problems chewing/swallowing. No nutrition diagnosis at this time. Will continue to monitor intake.   Dietitian # [phone]

## 2011-06-28 NOTE — Clinical Documentation Improvement (Signed)
RESPIRATORY FAILURE DOCUMENTATION CLARIFICATION QUERY   THIS DOCUMENT IS NOT A PERMANENT PART OF THE MEDICAL RECORD  TO RESPOND TO THE THIS QUERY, FOLLOW THE INSTRUCTIONS BELOW:  1. If needed, update documentation for the patient's encounter via the notes activity.  2. Access this query again and click edit on the In [employer].  3. After updating, or not, click F2 to complete all highlighted (required) fields concerning your review. Select "additional documentation in the medical record" OR "no additional documentation provided".  4. Click Sign note button.  5. The deficiency will fall out of your In Basket *Please let us know if you are not able to complete this workflow by phone or e-mail (listed below).  Please update your documentation within the medical record to reflect your response to this query.                                                                                    06/28/11  Dear Dr. Debby Bud Marton Redwood,  In a better effort to capture your patient's severity of illness, reflect appropriate length of stay and utilization of resources, a review of the patient medical record has revealed the following indicators.    Based on your clinical judgment, please clarify and document in a progress note and/or discharge summary the clinical condition associated with the following supporting information:  In responding to this query please exercise your independent judgment.  The fact that a query is asked, does not imply that any particular answer is desired or expected.  Pt admitted with SOB, exacerbation of COPD with hypoxemia.  For accurate  specificity & severity, please indicate if term "Hypoxemia "could  be further clarified in progress notes as any of listings below.  Thank you.  Possible Clinical Conditions?  Acute Respiratory Failure  Acute on Chronic Respiratory Failure  Chronic Respiratory Failure  Acute Respiratory Insufficiency  _______Other  Condition________________ _______Cannot Clinically Determine    Supporting Information: Risk Factors:AECOPD, History of CHF   Signs&Symptoms:  06/25/11 per ED notes /EMS report :" Unable to complete sentences upon ems arrival. EMS reports some relief from CPAP. 85% on RA, 100% on CPAP. Received 30mg  Lasix IVP en route"  per H&P: "used his home inhalers but symptoms persisted;  progressively worsening dyspnea, wheezing and chest tightness;   treated with CPAP by EMS;   85% on room air"  per PN:  "with hypoxemia; increased work of breathing  Treatment: O2 Mode: O2@ 2L/[place]/ CPAP on arrival  Solu-Medrol, LasixIV,  bronchodilator nebulizations, Oxygen therapy  You may use possible, probable, or suspect with inpatient documentation. possible, probable, suspected diagnoses MUST be documented at the time of discharge  Reviewed:  no additional documentation provided  Thank You,  Andy Gauss RN  Clinical Documentation Specialist:  Pager [phone] E-mail [email or web address]   Health Information Management [place]

## 2011-06-28 NOTE — Progress Notes (Signed)
Subjective: Awake and reports that he is feeling better. No complaints of shortness of breath He reports the wheezing is better  Objective: Lab: No new lab Imaging: No new imaging  Physical Exam: Filed Vitals:   06/28/11 0553  BP: 115/64  Pulse: 64  Temp: 97.7 F (36.5 C)  Resp: 18  thin, chronically ill appearing AA man in no distress PUlm - prolonged expiratory phase, mild rhonchi, expiratory wheezing Cor - RRR Neuro - awake and alert     Assessment/Plan: 1. Pulm - AECOPD #4 doxycycline, #3 high dose IV steroids. Making progress Plan - change to prednisone 30 mg bid            Check RA O2 sat at rest and with ambulation  2 & 3 - stable   Illene Regulus 06/28/2011, 7:03 AM

## 2011-06-28 NOTE — Progress Notes (Signed)
Notified Kathi Der, RN, Case Manager, of the daughter's request

## 2011-06-28 NOTE — Progress Notes (Signed)

## 2011-06-28 NOTE — Progress Notes (Signed)
O2 stats checked at rest 98 % RA with ambulation the low's pt got was 92% RA.  Called Dr Norrin's office and left the above information on MD's nurses voicemail.  Pt also requesting fosamax 70mg  that he takes Q Monday and a Home Health assistance after d/c.  Awaiting return call from MD. Jon Billings

## 2011-06-28 NOTE — Progress Notes (Signed)
[name] daughter Ms. Gerre Pebbles phoned and would like him evaluated for home oxygen which he currently does not have and she feels he needs a home health aide to assist him at discharge since he lives alone.

## 2011-06-29 ENCOUNTER — Telehealth: Payer: Self-pay | Admitting: Internal Medicine

## 2011-06-29 LAB — BASIC METABOLIC PANEL
CO2: 28 mEq/L (ref 19–32)
Chloride: 100 mEq/L (ref 96–112)
GFR calc non Af Amer: 79 mL/min — ABNORMAL LOW (ref >90)
Glucose, Bld: 107 mg/dL — ABNORMAL HIGH (ref 70–99)
Potassium: 4.2 mEq/L (ref 3.5–5.1)
Sodium: 136 mEq/L (ref 135–145)

## 2011-06-29 MED ORDER — PREDNISONE 10 MG PO TABS: 30.0000 mg | ORAL_TABLET | Freq: Two times a day (BID) | ORAL | Status: DC

## 2011-06-29 MED ORDER — GUAIFENESIN ER 600 MG PO TB12: 600.0000 mg | ORAL_TABLET | Freq: Two times a day (BID) | ORAL | Status: DC

## 2011-06-29 MED ORDER — DOXYCYCLINE HYCLATE 100 MG PO TABS: 100.0000 mg | ORAL_TABLET | Freq: Two times a day (BID) | ORAL | Status: DC

## 2011-06-29 NOTE — Telephone Encounter (Signed)
Please see 04.08.13 phone note/SLS

## 2011-06-29 NOTE — Telephone Encounter (Signed)
Called pt lvmom.

## 2011-06-29 NOTE — Telephone Encounter (Signed)
Spoke w/patient's daughter today Re: patient's SOB after speaking with patient via telephone. Pt reports that he is having No SOB or respiratory issues and is not in need of oxygen, relayed this message to daughter.  Also, per VO MEN there was ne need for oxygen when pt was D/C from hospital this morning, therefore, being that he did not meet the criteria/qualify, it would be $350. To obtain oxygen for patient at their own request. Informed MEN that patient's family would like to have the referral for Home Health consultation, so pt can be A&E for therapy needed; MD agreed.

## 2011-06-29 NOTE — Telephone Encounter (Signed)
Discharged this AM - good sats on RA with improved exam. He had O2 Sat yesterday with ambulation of 92%. He was sent home to finish doxy for 5 more days and prednisone taper.  Can be seen in office this PM if needed.

## 2011-06-29 NOTE — Telephone Encounter (Signed)
The pts daughter Koleen Nimrod) called and stated the patient is having difficulty breathing.  She states he can not catch his breath when walking and she is concerned that she will have to take him back to the hospital.  She is hoping for advice on what to do. Her callback number [phone].  Thanks!

## 2011-06-29 NOTE — Progress Notes (Signed)
Patient doing much better. O2 sats on room air are back to normal.  Plan - for d/c home to finish antibiotics and prednisone taper.  D/C done # [id number]

## 2011-06-30 NOTE — Discharge Summary (Signed)
[name], Dave NO.:  [id number]  MEDICAL RECORD NO.:  [id number]  LOCATION:  1501                         FACILITY:  [name]  PHYSICIAN:  [name]. Rollen Selders, MD  DATE OF BIRTH:  24-Sep-1926  DATE OF ADMISSION:  06/25/2011 DATE OF DISCHARGE:  06/29/2011                              DISCHARGE SUMMARY   ADMITTING DIAGNOSES: 1. Acute exacerbation of COPD with hypoxemia. 2. CHF, stable. 3. Hypokalemia, replenished. 4. Anemia with thrombocytopenia. 5. Prolonged QTc. 6. Question of bony invasion 7th rib, baseline chest x-ray.  DISCHARGE DIAGNOSES: 1. Acute exacerbation of COPD with hypoxemia. 2. CHF, stable. 3. Hypokalemia, replenished. 4. Anemia with thrombocytopenia. 5. Prolonged QTc. 6. Question of bony invasion 7th rib, baseline chest x-ray.  CONSULTANTS:  None.  PROCEDURES:  Chest x-ray at admission, which showed that there was enlargement of the 7th rib suggestive of metastatic disease.  Mild cardiomegaly noted.  Pulmonary vascular prominence noted.  HISTORY OF PRESENT ILLNESS:  [name] is an 78 year old African American gentleman with a history of COPD, chronic diastolic congestive heart failure, atrial fibrillation and known prostate cancer.  He was doing well until 2 days prior to admission when he developed a cough productive of copious yellow sputum, but he had no associated fever or chills.  The patient developed progressively worsening dyspnea, wheezing and chest tightness.  He was brought to the emergency department for evaluation where his oxygen saturation on room air was 85%.  The patient was subsequently admitted with acute exacerbation of his COPD. Please see the H and P for past medical history, family history, social history, and examination.  Please also note prior EPIC notes.  [name] COURSE: 1. Pulmonary the patient was started on Rocephin and doxycycline.  He     was rapidly converted to doxycycline alone completing 4 days  of 5     days of therapy while in the [name].  The patient was started on     high-dose IV steroids using Solu-Medrol 125 mg q.6 and this was     gradually tapered to where he was taking prednisone 30 mg b.i.d. at     the time of discharge.  The patient continued to respond and     improve on this regimen.  O2 sats were checked on room air, which     revealed the patient to do well with a room air saturation of 98%     at rest, 92% with exertion.  With the patient now doing well with     no significant shortness of breath, with only minimal expiratory     wheezing, with normal oxygen saturations on room air, he is stable     and ready for discharge home to complete a course of antibiotics     and a prednisone taper.  Plan, the patient will continue     doxycycline 100 mg b.i.d. for additional 5 days.  He will continue     on a prednisone taper at 30 mg p.o. b.i.d. x3 days, 40 mg daily x3,     20 mg daily x3, 10 mg daily x6 and off.  The patient will continue     all his  prior home medications for respiratory management. 2. Hypokalemia.  Final laboratory with a potassium of 4.2.  On the day     of discharge. 3. QT prolongation.  Final EKG on April 5, read as showing sinus     rhythm, probable left atrial abnormality, incomplete right bundle     branch block and left anterior fascicular block with no significant     change from prior studies.  No further evaluation is required. 4. Congestive heart failure.  The patient showed no signs of     decompensation during this hospitalization.  There was no     indication for any additional cardiac testing.  The patient will     continue on his home medications. 5. Anemia.  The patient's final blood count revealed a hemoglobin of     10.7 g, which is stable for him. With the patient's acute respiratory exacerbation and COPD being stabilized on oral medications he is at this point ready for discharge to home.  DISCHARGE EXAMINATION:  VITAL  SIGNS:  Temperature was 97.9, blood pressure 111/57, heart rate 88, respirations 18, oxygen saturation on room air at rest was 99%.  GENERAL APPEARANCE:  This is a thin chronically ill-appearing African American gentleman, in no acute distress. HEENT:  Exam was significant for mild temporal wasting.  Conjunctivae and sclerae were clear. NECK:  Supple. CHEST:  The patient has mild kyphosis.  He has increased AP diameter. No accessory muscle use for respirations.  He has a mildly prolonged expiratory phase.  He has end-expiratory wheezing, which is faint.  No rales were appreciated. CARDIOVASCULAR:  2+ radial pulses.  Precordium was quiet.  His heart sounds were distant but regular. ABDOMEN:  Soft. NEUROLOGIC:  The patient is awake, alert.  He is oriented to person, place, time, and context.  FINAL LABORATORIES:  CBC from April 7 with a white count of 7700, hemoglobin 10.7 g, platelet count 156,000.  Final chemistry from April 9, date of discharge, was sodium 136, potassium 4.2, chloride 100, CO2 of 28, BUN 35, creatinine 0.84 glucose 107.  DISPOSITION:  Patient is discharged home.  He will continue all his home medications.  He will be seen in the office in followup in 14 days, sooner on an as-needed basis. Patient's.  CONDITION:  At time of discharge dictation is stable and improved.     [name] Yehudah Standing, MD     MEN/MEDQ  D:  06/29/2011  T:  06/30/2011  Job:  [id number]

## 2011-06-30 NOTE — Telephone Encounter (Signed)
Home health referral placed in Epic for nursing evaluation/SLS LMOM to inform patient's daughter [original caller] about this matter.

## 2011-07-02 ENCOUNTER — Telehealth: Payer: Self-pay | Admitting: *Deleted

## 2011-07-02 NOTE — Telephone Encounter (Signed)
Hold lasix over the weekend and recheck next week

## 2011-07-02 NOTE — Telephone Encounter (Signed)
Left msg on triage stating pt was d/c from hosp 06/29/11. During home visit now BP 80/60 (sitting), 80/50 (standing) HR 120-156. Oxygen 96%. He denies any sxs. She advise pt to go back to ER, but pt states he doesn't feel like he need to go to Er. MD is out of office pls advise on what pt should do... 07/02/11@1 :43pm/LMB

## 2011-07-02 NOTE — Telephone Encounter (Signed)
Called both pt and nurse no answer LMOM RTC...07/02/11@2 :43pm/LMB

## 2011-07-02 NOTE — Telephone Encounter (Signed)
Called pt again still no answer LMOM md recommendations, and for him to return our call just for confirmation... 07/02/11@4 :19am/LMB

## 2011-07-05 NOTE — Telephone Encounter (Signed)
Called nurse back (cheryl) LMOM RTC still didn't received call back from pt... 07/05/11@11 :37am/LMB

## 2011-07-06 ENCOUNTER — Emergency Department (HOSPITAL_COMMUNITY)
Admission: EM | Admit: 2011-07-06 | Discharge: 2011-07-07 | Disposition: A | Discharge status: Home / Self Care | Payer: PRIVATE HEALTH INSURANCE | Attending: Emergency Medicine | Admitting: Emergency Medicine

## 2011-07-06 ENCOUNTER — Encounter (HOSPITAL_COMMUNITY): Payer: Self-pay

## 2011-07-06 DIAGNOSIS — R079 Chest pain, unspecified: Secondary | ICD-10-CM | POA: Insufficient documentation

## 2011-07-06 DIAGNOSIS — Z7982 Long term (current) use of aspirin: Secondary | ICD-10-CM | POA: Insufficient documentation

## 2011-07-06 DIAGNOSIS — Z79899 Other long term (current) drug therapy: Secondary | ICD-10-CM | POA: Insufficient documentation

## 2011-07-06 DIAGNOSIS — R1013 Epigastric pain: Secondary | ICD-10-CM | POA: Insufficient documentation

## 2011-07-06 DIAGNOSIS — J449 Chronic obstructive pulmonary disease, unspecified: Secondary | ICD-10-CM | POA: Insufficient documentation

## 2011-07-06 DIAGNOSIS — I5032 Chronic diastolic (congestive) heart failure: Secondary | ICD-10-CM | POA: Insufficient documentation

## 2011-07-06 DIAGNOSIS — I4891 Unspecified atrial fibrillation: Secondary | ICD-10-CM | POA: Insufficient documentation

## 2011-07-06 DIAGNOSIS — J4489 Other specified chronic obstructive pulmonary disease: Secondary | ICD-10-CM | POA: Insufficient documentation

## 2011-07-06 LAB — CBC
HCT: 37.8 % — ABNORMAL LOW (ref 39.0–52.0)
Hemoglobin: 12 g/dL — ABNORMAL LOW (ref 13.0–17.0)
MCH: 25.2 pg — ABNORMAL LOW (ref 26.0–34.0)
MCV: 79.2 fL (ref 78.0–100.0)
RBC: 4.77 MIL/uL (ref 4.22–5.81)

## 2011-07-06 NOTE — Telephone Encounter (Signed)
Have left several msg with nurse & pt  Still no call back closing note... 44/16/13@12 :04pm/LMB

## 2011-07-06 NOTE — ED Notes (Signed)
Pt complains of epigastric pain that started last night,

## 2011-07-07 ENCOUNTER — Emergency Department (HOSPITAL_COMMUNITY): Payer: PRIVATE HEALTH INSURANCE

## 2011-07-07 LAB — COMPREHENSIVE METABOLIC PANEL
ALT: 17 U/L (ref 0–53)
CO2: 27 mEq/L (ref 19–32)
Calcium: 8.6 mg/dL (ref 8.4–10.5)
GFR calc Af Amer: 88 mL/min — ABNORMAL LOW (ref >90)
GFR calc non Af Amer: 76 mL/min — ABNORMAL LOW (ref >90)
Glucose, Bld: 89 mg/dL (ref 70–99)
Sodium: 140 mEq/L (ref 135–145)

## 2011-07-07 MED ORDER — OMEPRAZOLE 20 MG PO CPDR: 20.0000 mg | DELAYED_RELEASE_CAPSULE | Freq: Every day | ORAL | Status: DC
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
## 2011-07-12 DIAGNOSIS — J441 Chronic obstructive pulmonary disease with (acute) exacerbation: Secondary | ICD-10-CM

## 2011-07-12 DIAGNOSIS — I509 Heart failure, unspecified: Secondary | ICD-10-CM

## 2011-07-12 DIAGNOSIS — I4891 Unspecified atrial fibrillation: Secondary | ICD-10-CM

## 2011-07-12 DIAGNOSIS — I5030 Unspecified diastolic (congestive) heart failure: Secondary | ICD-10-CM

## 2011-07-13 ENCOUNTER — Encounter: Payer: Self-pay | Admitting: Internal Medicine

## 2011-07-13 ENCOUNTER — Other Ambulatory Visit (INDEPENDENT_AMBULATORY_CARE_PROVIDER_SITE_OTHER): Payer: PRIVATE HEALTH INSURANCE

## 2011-07-13 ENCOUNTER — Ambulatory Visit (INDEPENDENT_AMBULATORY_CARE_PROVIDER_SITE_OTHER): Payer: PRIVATE HEALTH INSURANCE | Admitting: Internal Medicine

## 2011-07-13 VITALS — BP 106/60 | HR 84 | Temp 97.0°F | Resp 16 | Wt 136.0 lb

## 2011-07-13 DIAGNOSIS — M899 Disorder of bone, unspecified: Secondary | ICD-10-CM

## 2011-07-13 DIAGNOSIS — J449 Chronic obstructive pulmonary disease, unspecified: Secondary | ICD-10-CM

## 2011-07-13 DIAGNOSIS — I10 Essential (primary) hypertension: Secondary | ICD-10-CM

## 2011-07-13 DIAGNOSIS — D649 Anemia, unspecified: Secondary | ICD-10-CM

## 2011-07-13 DIAGNOSIS — M949 Disorder of cartilage, unspecified: Secondary | ICD-10-CM

## 2011-07-13 DIAGNOSIS — I4891 Unspecified atrial fibrillation: Secondary | ICD-10-CM

## 2011-07-13 DIAGNOSIS — E538 Deficiency of other specified B group vitamins: Secondary | ICD-10-CM

## 2011-07-13 LAB — COMPREHENSIVE METABOLIC PANEL
ALT: 21 U/L (ref 0–53)
AST: 19 U/L (ref 0–37)
Albumin: 3.1 g/dL — ABNORMAL LOW (ref 3.5–5.2)
Alkaline Phosphatase: 36 U/L — ABNORMAL LOW (ref 39–117)
Calcium: 8.2 mg/dL — ABNORMAL LOW (ref 8.4–10.5)
Chloride: 103 mEq/L (ref 96–112)
Creatinine, Ser: 1.1 mg/dL (ref 0.4–1.5)
Potassium: 4.6 mEq/L (ref 3.5–5.1)
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
## 2011-07-13 NOTE — Patient Instructions (Signed)
Lungs- chronic emphysema but seemingly stable. Good oxygen saturation. Plan - continue your present regimen  Heart - regular rhythm today. No signs of heart failure. There is peripheral swelling. Plan - continue your present medications including the lasix twice a day.  Blood pressure - ok to day. It is better to take the diltiazem 30 mg 3 times a day.  Lytic lesions in thoracic spine and ribs (seen on CT since '08): may be from the prostate cancer and treated with the alendronate, may be myeloma or other. PLEASE get me records from Dr. Abbe Amsterdam in [place] - this may shed light on the situation.  Potassium - will check lab  Please come to see me every 3 months so I can keep you well and less getting you out of trouble.

## 2011-07-15 ENCOUNTER — Encounter: Payer: Self-pay | Admitting: Internal Medicine

## 2011-07-15 DIAGNOSIS — M899 Disorder of bone, unspecified: Secondary | ICD-10-CM | POA: Insufficient documentation

## 2011-07-15 NOTE — Assessment & Plan Note (Signed)
Lab Results  Component Value Date   VITAMINB12 671 08/11/2010

## 2011-07-15 NOTE — Assessment & Plan Note (Signed)
BP Readings from Last 3 Encounters:  07/13/11 106/60  07/07/11 118/92  06/29/11 111/57   Stable and doing well.

## 2011-07-15 NOTE — Assessment & Plan Note (Signed)
Recent hospitalization for exacerbation. Oximetry at rest and with ambulation revealed adequate oxygenation: > 90%  He is doing ok with adequate O2 sat in the office.

## 2011-07-15 NOTE — Assessment & Plan Note (Signed)
Patient continues with periodic treatment with Dr. Abbe Amsterdam and aransep infusion.  Lab Results  Component Value Date   HGB 12.0* 07/06/2011

## 2011-07-15 NOTE — Assessment & Plan Note (Signed)
Hemodynamically stable with NSR vs excellent rate control.   Plan - continue present medications.

## 2011-07-18 ENCOUNTER — Encounter: Payer: Self-pay | Admitting: Internal Medicine

## 2011-07-19 ENCOUNTER — Telehealth: Payer: Self-pay | Admitting: Internal Medicine

## 2011-07-19 NOTE — Telephone Encounter (Signed)
Received copies from Dr. Marrian Salvage 07/19/11 . Forwarded 14 pages to Dr. Debby Bud ,for review.

## 2011-07-23 ENCOUNTER — Telehealth: Payer: Self-pay | Admitting: *Deleted

## 2011-07-23 NOTE — Telephone Encounter (Signed)
Thank you. Was able to ask Dr. Debby Bud about this also. I have notified the nurse Beth at [facility] of order to stop medication for today only. By Dr. Debby Bud. [name]

## 2011-07-23 NOTE — Telephone Encounter (Signed)
Nurse Beth with [facility] has concerns that Mr. Bruemmer is not considered Homebound  Or Homecare due to his never at his residence or unable to reach by phone. She saw patiient today and explained this to him. Patient states he had things to do and could not just stay there. He has been driving, going to sisters home and to the bank. Beth to make another home visit next week and will see if he needs to be discharged from their service.  Advise . Fannie Knee

## 2011-07-23 NOTE — Telephone Encounter (Signed)
I am covering for MEN on his day off and am also unfamiliar with pt - however, as pt is asymptomatic per [facility] report, I feel ok to continue to meds as rx'd  - HH can call again over weekend or next week if other questions/concerns. thanks

## 2011-07-23 NOTE — Telephone Encounter (Signed)
Nurse Beth from [facility] called this am 9 30 am  For [name],. This is the first visit they have had with patient . Have been unable to see him at home address. Has been at other relatives homes since April 12th.  Wt today is 138#. BP 82/48. Pulse 92. No dizziness. Nurse states patient has not took am meds of lasix 40mg   bid. dipazem 30mg   Tid. Loose cough thin mucus skin dry. Some swelling in ankles 1-2+. Please advise as to if patient should go ahead and take am meds.  Waynetta Sandy [phone]    Fannie Knee

## 2011-08-10 NOTE — Telephone Encounter (Signed)
BETH WITH [facility] DID FOLLOW UP VISIT TODAY WITH PATIENT. STATES HE DOES NOT MEET HOMEBOUND STATUS ANYMORE. THIS WILL BE LAST VISIT. BLOOD PRESSURE TODAY SITTING 100/58 AND STANDING 90/52. PATIENT STATES LAST WEEK WITH ONCOLOGIST IT WAS LOW ALSO. HE DENIES DIZZINESS STATES FEELS FINE.[name] NEXT APPT WITH MD IS IN ;JULY 2013

## 2011-10-12 ENCOUNTER — Ambulatory Visit (INDEPENDENT_AMBULATORY_CARE_PROVIDER_SITE_OTHER): Payer: PRIVATE HEALTH INSURANCE | Admitting: Internal Medicine

## 2011-10-12 ENCOUNTER — Encounter: Payer: Self-pay | Admitting: Internal Medicine

## 2011-10-12 VITALS — BP 102/58 | HR 69 | Temp 97.4°F | Resp 16 | Wt 137.0 lb

## 2011-10-12 DIAGNOSIS — M899 Disorder of bone, unspecified: Secondary | ICD-10-CM

## 2011-10-12 DIAGNOSIS — D649 Anemia, unspecified: Secondary | ICD-10-CM

## 2011-10-12 DIAGNOSIS — N400 Enlarged prostate without lower urinary tract symptoms: Secondary | ICD-10-CM

## 2011-10-12 DIAGNOSIS — I5032 Chronic diastolic (congestive) heart failure: Secondary | ICD-10-CM

## 2011-10-12 DIAGNOSIS — I10 Essential (primary) hypertension: Secondary | ICD-10-CM

## 2011-10-12 DIAGNOSIS — J449 Chronic obstructive pulmonary disease, unspecified: Secondary | ICD-10-CM

## 2011-10-12 DIAGNOSIS — M949 Disorder of cartilage, unspecified: Secondary | ICD-10-CM

## 2011-10-12 MED ORDER — ALENDRONATE SODIUM 70 MG PO TABS: 70.0000 mg | ORAL_TABLET | ORAL | Status: DC

## 2011-10-12 NOTE — Patient Instructions (Addendum)
Anemia - anemia of chronic disease. Current with [name] and Dr. Julio Alm.  Blood pressure is well controlled - continue present medications  Prostate - slow stream: wait, take your time and then wait some more. Continue your present medications.  COPD - stable on present medication.  Over all your are in pretty good condition for the condition that you are in.

## 2011-10-12 NOTE — Assessment & Plan Note (Signed)
Reviewed last two office visit notes: he appears stable. Is getting Aranesp only when Hgb is low.  Plan- per Dr. Julio Alm and PA [facility].

## 2011-10-12 NOTE — Assessment & Plan Note (Signed)
No symptoms or change in condition.
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
## 2011-10-12 NOTE — Assessment & Plan Note (Signed)
Doing well on chronic/maintenance medications. No limitations in activity.  Plan  Continue present meds

## 2011-10-12 NOTE — Assessment & Plan Note (Signed)
BP Readings from Last 3 Encounters:  10/12/11 102/58  07/13/11 106/60  07/07/11 118/92   Very good control on present medications. Even with slightly low pressure no symptoms reported.  Plan Continue present medications

## 2011-10-12 NOTE — Assessment & Plan Note (Signed)
Patient with increasing symptoms of prostatism: slow to start, slow stream, post-void dribble and urinary frequency. He is on fenesteride and tamsulosin. He does complain of symptoms that would suggest retention.  Plan Continue present medications

## 2011-10-12 NOTE — Assessment & Plan Note (Signed)
Last OV w/ Dr. Graciela Husbands 12/29/10. He is stable at today's visit. Due for follow-up with Dr. Graciela Husbands in October

## 2011-10-21 ENCOUNTER — Other Ambulatory Visit: Payer: Self-pay | Admitting: *Deleted

## 2011-10-21 MED ORDER — IPRATROPIUM-ALBUTEROL 20-100 MCG/ACT IN AERS: 2.0000 | INHALATION_SPRAY | Freq: Four times a day (QID) | RESPIRATORY_TRACT | Status: DC

## 2011-11-01 ENCOUNTER — Other Ambulatory Visit: Payer: Self-pay | Admitting: Internal Medicine

## 2011-11-04 ENCOUNTER — Emergency Department (HOSPITAL_COMMUNITY): Payer: PRIVATE HEALTH INSURANCE

## 2011-11-04 ENCOUNTER — Inpatient Hospital Stay (HOSPITAL_COMMUNITY)
Admission: EM | Admit: 2011-11-04 | Discharge: 2011-11-05 | DRG: 190 | Disposition: A | Discharge status: Home / Self Care | Payer: PRIVATE HEALTH INSURANCE | Attending: Internal Medicine | Admitting: Internal Medicine

## 2011-11-04 ENCOUNTER — Encounter (HOSPITAL_COMMUNITY): Payer: Self-pay | Admitting: Emergency Medicine

## 2011-11-04 ENCOUNTER — Other Ambulatory Visit: Payer: Self-pay | Admitting: *Deleted

## 2011-11-04 DIAGNOSIS — J189 Pneumonia, unspecified organism: Secondary | ICD-10-CM | POA: Diagnosis present

## 2011-11-04 DIAGNOSIS — I5032 Chronic diastolic (congestive) heart failure: Secondary | ICD-10-CM | POA: Diagnosis present

## 2011-11-04 DIAGNOSIS — I071 Rheumatic tricuspid insufficiency: Secondary | ICD-10-CM | POA: Diagnosis present

## 2011-11-04 DIAGNOSIS — J441 Chronic obstructive pulmonary disease with (acute) exacerbation: Principal | ICD-10-CM | POA: Diagnosis present

## 2011-11-04 DIAGNOSIS — M8500 Fibrous dysplasia (monostotic), unspecified site: Secondary | ICD-10-CM | POA: Insufficient documentation

## 2011-11-04 DIAGNOSIS — I509 Heart failure, unspecified: Secondary | ICD-10-CM | POA: Diagnosis present

## 2011-11-04 DIAGNOSIS — J449 Chronic obstructive pulmonary disease, unspecified: Secondary | ICD-10-CM | POA: Diagnosis present

## 2011-11-04 DIAGNOSIS — M7989 Other specified soft tissue disorders: Secondary | ICD-10-CM

## 2011-11-04 DIAGNOSIS — M899 Disorder of bone, unspecified: Secondary | ICD-10-CM | POA: Diagnosis present

## 2011-11-04 DIAGNOSIS — C61 Malignant neoplasm of prostate: Secondary | ICD-10-CM | POA: Diagnosis present

## 2011-11-04 DIAGNOSIS — D649 Anemia, unspecified: Secondary | ICD-10-CM

## 2011-11-04 DIAGNOSIS — D509 Iron deficiency anemia, unspecified: Secondary | ICD-10-CM | POA: Diagnosis present

## 2011-11-04 DIAGNOSIS — I4891 Unspecified atrial fibrillation: Secondary | ICD-10-CM

## 2011-11-04 LAB — CBC WITH DIFFERENTIAL/PLATELET
Basophils Absolute: 0 10*3/uL (ref 0.0–0.1)
Basophils Relative: 0 % (ref 0–1)
Eosinophils Absolute: 0.2 10*3/uL (ref 0.0–0.7)
Eosinophils Relative: 3 % (ref 0–5)
HCT: 32.3 % — ABNORMAL LOW (ref 39.0–52.0)
Hemoglobin: 10.3 g/dL — ABNORMAL LOW (ref 13.0–17.0)
MCH: 25.1 pg — ABNORMAL LOW (ref 26.0–34.0)
MCHC: 31.9 g/dL (ref 30.0–36.0)
MCV: 78.8 fL (ref 78.0–100.0)
Monocytes Absolute: 0.3 10*3/uL (ref 0.1–1.0)
Monocytes Relative: 5 % (ref 3–12)
Neutro Abs: 5.3 10*3/uL (ref 1.7–7.7)
RDW: 19.4 % — ABNORMAL HIGH (ref 11.5–15.5)

## 2011-11-04 LAB — URINALYSIS, ROUTINE W REFLEX MICROSCOPIC
Bilirubin Urine: NEGATIVE
Glucose, UA: NEGATIVE mg/dL
Hgb urine dipstick: NEGATIVE
Ketones, ur: NEGATIVE mg/dL
Protein, ur: NEGATIVE mg/dL
Urobilinogen, UA: 1 mg/dL (ref 0.0–1.0)

## 2011-11-04 LAB — BASIC METABOLIC PANEL
BUN: 15 mg/dL (ref 6–23)
Chloride: 99 mEq/L (ref 96–112)
Creatinine, Ser: 0.8 mg/dL (ref 0.50–1.35)
GFR calc Af Amer: >90 mL/min (ref >90)
Glucose, Bld: 103 mg/dL — ABNORMAL HIGH (ref 70–99)
Potassium: 3.7 mEq/L (ref 3.5–5.1)

## 2011-11-04 LAB — CARDIAC PANEL(CRET KIN+CKTOT+MB+TROPI)
Relative Index: INVALID (ref 0.0–2.5)
Total CK: 52 U/L (ref 7–232)

## 2011-11-04 LAB — BLOOD GAS, ARTERIAL
Bicarbonate: 24.9 mEq/L — ABNORMAL HIGH (ref 20.0–24.0)
O2 Saturation: 96.6 %
Patient temperature: 98.6
TCO2: 23 mmol/L (ref 0–100)
pH, Arterial: 7.401 (ref 7.350–7.450)

## 2011-11-04 LAB — MAGNESIUM: Magnesium: 1.9 mg/dL (ref 1.5–2.5)

## 2011-11-04 MED ORDER — FLUTICASONE-SALMETEROL 115-21 MCG/ACT IN AERO
2.0000 | INHALATION_SPRAY | Freq: Two times a day (BID) | RESPIRATORY_TRACT | Status: DC
  Filled 2011-11-04: qty 8

## 2011-11-04 MED ORDER — DOXYCYCLINE HYCLATE 100 MG PO TABS
100.0000 mg | ORAL_TABLET | Freq: Once | ORAL | Status: AC
  Administered 2011-11-04: 100 mg via ORAL
  Filled 2011-11-04: qty 1

## 2011-11-04 MED ORDER — DEXTROSE 5 % IV SOLN
1.0000 g | INTRAVENOUS | Status: DC
  Filled 2011-11-04: qty 10

## 2011-11-04 MED ORDER — FUROSEMIDE 40 MG PO TABS
40.0000 mg | ORAL_TABLET | Freq: Two times a day (BID) | ORAL | Status: DC
  Administered 2011-11-04 – 2011-11-05 (×2): 40 mg via ORAL
  Filled 2011-11-04 (×3): qty 1

## 2011-11-04 MED ORDER — DEXTROSE 5 % IV SOLN
1.0000 g | Freq: Once | INTRAVENOUS | Status: AC
  Administered 2011-11-04: 11:00:00 via INTRAVENOUS
  Filled 2011-11-04: qty 10

## 2011-11-04 MED ORDER — ALBUTEROL (5 MG/ML) CONTINUOUS INHALATION SOLN
10.0000 mg/h | INHALATION_SOLUTION | Freq: Once | RESPIRATORY_TRACT | Status: AC
  Administered 2011-11-04: 10 mg/h via RESPIRATORY_TRACT

## 2011-11-04 MED ORDER — PANTOPRAZOLE SODIUM 40 MG PO TBEC
40.0000 mg | DELAYED_RELEASE_TABLET | Freq: Every day | ORAL | Status: DC
  Filled 2011-11-04: qty 1

## 2011-11-04 MED ORDER — SODIUM CHLORIDE 0.9 % IV SOLN
Freq: Once | INTRAVENOUS | Status: AC
  Administered 2011-11-04: 09:00:00 via INTRAVENOUS

## 2011-11-04 MED ORDER — ALBUTEROL SULFATE (5 MG/ML) 0.5% IN NEBU: 5.0000 mg | INHALATION_SOLUTION | RESPIRATORY_TRACT | Status: DC

## 2011-11-04 MED ORDER — ASPIRIN EC 81 MG PO TBEC
81.0000 mg | DELAYED_RELEASE_TABLET | Freq: Every day | ORAL | Status: DC
  Administered 2011-11-04 – 2011-11-05 (×2): 81 mg via ORAL
  Filled 2011-11-04 (×2): qty 1

## 2011-11-04 MED ORDER — SODIUM CHLORIDE 0.9 % IJ SOLN: 3.0000 mL | Freq: Two times a day (BID) | INTRAMUSCULAR | Status: DC

## 2011-11-04 MED ORDER — TAMSULOSIN HCL 0.4 MG PO CAPS
0.4000 mg | ORAL_CAPSULE | Freq: Every day | ORAL | Status: DC
  Administered 2011-11-05: 0.4 mg via ORAL
  Filled 2011-11-04 (×2): qty 1

## 2011-11-04 MED ORDER — DILTIAZEM HCL ER COATED BEADS 120 MG PO CP24
120.0000 mg | ORAL_CAPSULE | Freq: Once | ORAL | Status: AC
  Administered 2011-11-04: 120 mg via ORAL
  Filled 2011-11-04: qty 1

## 2011-11-04 MED ORDER — LEVALBUTEROL HCL 0.63 MG/3ML IN NEBU
0.6300 mg | INHALATION_SOLUTION | RESPIRATORY_TRACT | Status: DC
  Administered 2011-11-04 – 2011-11-05 (×5): 0.63 mg via RESPIRATORY_TRACT
  Filled 2011-11-04 (×10): qty 3

## 2011-11-04 MED ORDER — ALBUTEROL SULFATE (5 MG/ML) 0.5% IN NEBU
INHALATION_SOLUTION | RESPIRATORY_TRACT | Status: AC
  Administered 2011-11-04: 09:00:00
  Filled 2011-11-04: qty 2

## 2011-11-04 MED ORDER — ACETAMINOPHEN 650 MG RE SUPP: 650.0000 mg | Freq: Four times a day (QID) | RECTAL | Status: DC | PRN

## 2011-11-04 MED ORDER — DILTIAZEM HCL ER COATED BEADS 120 MG PO CP24
120.0000 mg | ORAL_CAPSULE | Freq: Every day | ORAL | Status: DC
  Administered 2011-11-05: 120 mg via ORAL
  Filled 2011-11-04: qty 1

## 2011-11-04 MED ORDER — SODIUM CHLORIDE 0.9 % IJ SOLN
3.0000 mL | Freq: Two times a day (BID) | INTRAMUSCULAR | Status: DC
  Administered 2011-11-04: 3 mL via INTRAVENOUS

## 2011-11-04 MED ORDER — MORPHINE SULFATE 2 MG/ML IJ SOLN: 1.0000 mg | INTRAMUSCULAR | Status: DC | PRN

## 2011-11-04 MED ORDER — FINASTERIDE 5 MG PO TABS
5.0000 mg | ORAL_TABLET | Freq: Every day | ORAL | Status: DC
  Administered 2011-11-04 – 2011-11-05 (×2): 5 mg via ORAL
  Filled 2011-11-04 (×2): qty 1

## 2011-11-04 MED ORDER — FLUTICASONE-SALMETEROL 115-21 MCG/ACT IN AERO
2.0000 | INHALATION_SPRAY | Freq: Two times a day (BID) | RESPIRATORY_TRACT | Status: DC
  Administered 2011-11-04: 2 via RESPIRATORY_TRACT

## 2011-11-04 MED ORDER — DEXTROSE 5 % IV SOLN
500.0000 mg | INTRAVENOUS | Status: DC
  Administered 2011-11-04: 500 mg via INTRAVENOUS
  Filled 2011-11-04: qty 500

## 2011-11-04 MED ORDER — LEVALBUTEROL HCL 0.63 MG/3ML IN NEBU
0.6300 mg | INHALATION_SOLUTION | RESPIRATORY_TRACT | Status: DC | PRN
  Filled 2011-11-04 (×2): qty 3

## 2011-11-04 MED ORDER — IPRATROPIUM BROMIDE 0.02 % IN SOLN
0.5000 mg | RESPIRATORY_TRACT | Status: DC
  Administered 2011-11-04 (×2): 0.5 mg via RESPIRATORY_TRACT
  Administered 2011-11-04: 09:00:00 via RESPIRATORY_TRACT
  Administered 2011-11-05 (×2): 0.5 mg via RESPIRATORY_TRACT
  Filled 2011-11-04 (×5): qty 2.5

## 2011-11-04 MED ORDER — ONDANSETRON HCL 4 MG/2ML IJ SOLN: 4.0000 mg | Freq: Four times a day (QID) | INTRAMUSCULAR | Status: DC | PRN

## 2011-11-04 MED ORDER — ONDANSETRON HCL 4 MG PO TABS: 4.0000 mg | ORAL_TABLET | Freq: Four times a day (QID) | ORAL | Status: DC | PRN

## 2011-11-04 MED ORDER — SODIUM CHLORIDE 0.9 % IV BOLUS (SEPSIS)
500.0000 mL | Freq: Once | INTRAVENOUS | Status: AC
  Administered 2011-11-04: 500 mL via INTRAVENOUS

## 2011-11-04 MED ORDER — ACETAMINOPHEN 325 MG PO TABS: 650.0000 mg | ORAL_TABLET | Freq: Four times a day (QID) | ORAL | Status: DC | PRN

## 2011-11-04 MED ORDER — GUAIFENESIN ER 600 MG PO TB12
600.0000 mg | ORAL_TABLET | Freq: Two times a day (BID) | ORAL | Status: DC | PRN
  Filled 2011-11-04: qty 1

## 2011-11-04 MED ORDER — METHYLPREDNISOLONE SODIUM SUCC 125 MG IJ SOLR
60.0000 mg | Freq: Three times a day (TID) | INTRAMUSCULAR | Status: DC
  Administered 2011-11-04 – 2011-11-05 (×3): 60 mg via INTRAVENOUS
  Filled 2011-11-04 (×6): qty 0.96

## 2011-11-04 MED ORDER — IPRATROPIUM BROMIDE 0.02 % IN SOLN
RESPIRATORY_TRACT | Status: AC
  Filled 2011-11-04: qty 2.5

## 2011-11-04 MED ORDER — IPRATROPIUM BROMIDE 0.02 % IN SOLN
0.5000 mg | Freq: Once | RESPIRATORY_TRACT | Status: AC
  Administered 2011-11-04: 0.5 mg via RESPIRATORY_TRACT
  Filled 2011-11-04: qty 2.5

## 2011-11-04 MED ORDER — METHYLPREDNISOLONE SODIUM SUCC 125 MG IJ SOLR
INTRAMUSCULAR | Status: AC
  Administered 2011-11-04: 09:00:00
  Filled 2011-11-04: qty 2

## 2011-11-04 MED ORDER — ENOXAPARIN SODIUM 40 MG/0.4ML ~~LOC~~ SOLN
40.0000 mg | SUBCUTANEOUS | Status: DC
  Administered 2011-11-04: 40 mg via SUBCUTANEOUS
  Filled 2011-11-04 (×2): qty 0.4

## 2011-11-04 MED ORDER — DOCUSATE SODIUM 100 MG PO CAPS
100.0000 mg | ORAL_CAPSULE | Freq: Two times a day (BID) | ORAL | Status: DC
  Administered 2011-11-04 – 2011-11-05 (×2): 100 mg via ORAL
  Filled 2011-11-04 (×3): qty 1

## 2011-11-04 MED ORDER — DEXTROSE 5 % IV SOLN
500.0000 mg | INTRAVENOUS | Status: DC
  Filled 2011-11-04: qty 500

## 2011-11-04 NOTE — ED Notes (Signed)
BILAT LE VASCULAR DONE AT BEDSIDE

## 2011-11-04 NOTE — Progress Notes (Signed)
This is a patient of Dr. Illene Regulus.  I have left a message on [phone] for their call coverage group to assume care of this patient tomorrow 11/05/2011.   Disposition Note  [name], is a 78 y.o. male,   MRN: [id number]  -  DOB - Oct 26, 1926  Outpatient Primary MD for the patient is Illene Regulus, MD   Blood pressure 127/57, pulse 114, temperature 98.1 F (36.7 C), temperature source Oral, resp. rate 18, height 5\' 8"  (1.727 m), weight 59.421 kg (131 lb), SpO2 100.00%.  Active Problems:  COPD (chronic obstructive pulmonary disease)  Heart failure, diastolic, chronic  Campath-induced atrial fibrillation  Lytic bone lesions on xray  Prostate cancer  Tricuspid regurgitation  Anemia, iron deficiency   78 yo male comes to the ED via EMS with complaints of being SOB all week, but was acutely worse this am.  He has yellow/green phlegm and worsening orthopnea.  He also complains of R>L leg swelling.  Venous Duplex was ordered in the ED.  Initially the EDP felt he was in Resp Distress and ordered Bi-Pap however his ABG did not look bad and the patient improved so bi-pap was cancelled.  Per the EDPs exam he has rhonchi bilaterally.  The patient is still in uncontrolled afib, but he has not yet had his cardizem this am.  He has received Nebs, Steroids and Doxy/Rocephin in the ED.  I will request a telemetry bed due to his rapid heart rate.    Algis Downs, PA-C [facility] Pager: [phone]
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
## 2011-11-04 NOTE — ED Notes (Signed)
[id number]<AV> Expected date:11/04/11<BR> Expected time: 7:45 AM<BR> Means of arrival:<BR> Comments:<BR> 83 male from home SOB

## 2011-11-04 NOTE — ED Notes (Signed)
Pt here via EMS for c/o sob x1 wk with increase sob  this am,in route received atrovent and albuterol Tx and solumedrol ivp

## 2011-11-04 NOTE — ED Notes (Signed)
MD at bedside. 
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
## 2011-11-04 NOTE — Progress Notes (Signed)
VASCULAR LAB PRELIMINARY  PRELIMINARY  PRELIMINARY  PRELIMINARY  Right lower extremity venous duplex completed.    Preliminary report:  Right:  No evidence of DVT, superficial thrombosis, or Baker's cyst.  Malikai Gut, RVT 11/04/2011, 2:14 PM

## 2011-11-05 DIAGNOSIS — J449 Chronic obstructive pulmonary disease, unspecified: Secondary | ICD-10-CM

## 2011-11-05 LAB — COMPREHENSIVE METABOLIC PANEL
Albumin: 3 g/dL — ABNORMAL LOW (ref 3.5–5.2)
BUN: 15 mg/dL (ref 6–23)
Chloride: 103 mEq/L (ref 96–112)
Creatinine, Ser: 0.79 mg/dL (ref 0.50–1.35)
GFR calc Af Amer: >90 mL/min (ref >90)
GFR calc non Af Amer: 81 mL/min — ABNORMAL LOW (ref >90)
Glucose, Bld: 163 mg/dL — ABNORMAL HIGH (ref 70–99)
Total Bilirubin: 0.3 mg/dL (ref 0.3–1.2)

## 2011-11-05 LAB — CBC
MCV: 78.8 fL (ref 78.0–100.0)
Platelets: 132 10*3/uL — ABNORMAL LOW (ref 150–400)
RBC: 3.96 MIL/uL — ABNORMAL LOW (ref 4.22–5.81)
WBC: 6.6 10*3/uL (ref 4.0–10.5)

## 2011-11-05 LAB — LEGIONELLA ANTIGEN, URINE: Legionella Antigen, Urine: NEGATIVE

## 2011-11-05 LAB — CARDIAC PANEL(CRET KIN+CKTOT+MB+TROPI)
CK, MB: 4.9 ng/mL — ABNORMAL HIGH (ref 0.3–4.0)
Relative Index: INVALID (ref 0.0–2.5)
Relative Index: INVALID (ref 0.0–2.5)
Total CK: 45 U/L (ref 7–232)
Total CK: 57 U/L (ref 7–232)
Troponin I: <0.3 ng/mL (ref <0.30)

## 2011-11-05 MED ORDER — IPRATROPIUM-ALBUTEROL 0.5-2.5 (3) MG/3ML IN SOLN: 3.0000 mL | Freq: Four times a day (QID) | RESPIRATORY_TRACT | Status: DC

## 2011-11-05 MED ORDER — PREDNISONE 10 MG PO TABS: 10.0000 mg | ORAL_TABLET | Freq: Every day | ORAL | Status: DC

## 2011-11-05 NOTE — Progress Notes (Signed)
Subjective: [name] was unable touse the new combivent replacement due to difficulty with device, thus was witout medication for several days. ONthe AM of admission he had increased SOB and cough leading to ED evaluation. He was subsequently admitted with a diagnosis of CAP although there was no infiltrate on x-ray, WBC was normal and he had no hypoxemia or sputum production.   Objective: Lab: Lab Results  Component Value Date   WBC 6.6 11/05/2011   HGB 9.9* 11/05/2011   HCT 31.2* 11/05/2011   MCV 78.8 11/05/2011   PLT 132* 11/05/2011   BMET    Component Value Date/Time   NA 136 11/05/2011 0425   K 4.0 11/05/2011 0425   CL 103 11/05/2011 0425   CO2 23 11/05/2011 0425   GLUCOSE 163* 11/05/2011 0425   BUN 15 11/05/2011 0425   CREATININE 0.79 11/05/2011 0425   CALCIUM 8.8 11/05/2011 0425   GFRNONAA 81* 11/05/2011 0425   GFRAA >90 11/05/2011 0425   Cardiac Panel (last 3 results)  Basename 11/05/11 0740 11/05/11 0020 11/04/11 1642  CKTOTAL 45 57 52  CKMB 4.9* 5.2* 4.4*  TROPONINI <0.30 <0.30 <0.30  RELINDX RELATIVE INDEX IS INVALID RELATIVE INDEX IS INVALID RELATIVE INDEX IS INVALID   TSH 0.74, HIV - NR,   Imaging: CXR 11/04/11: IMPRESSION:  Stable hyperinflation and chronic mild interstitial prominence.  Expansile lesion of the right seventh rib is stable. There is  small left pleural effusion with streaky left basilar atelectasis  or infiltrate.   Scheduled Meds:   . albuterol      . albuterol  10 mg/hr Nebulization Once  . aspirin EC  81 mg Oral Daily  . azithromycin  500 mg Intravenous Q24H  . cefTRIAXone (ROCEPHIN)  IV  1 g Intravenous Once  . cefTRIAXone (ROCEPHIN)  IV  1 g Intravenous Q24H  . diltiazem  120 mg Oral Once  . diltiazem  120 mg Oral Daily  . docusate sodium  100 mg Oral BID  . doxycycline  100 mg Oral Once  . enoxaparin (LOVENOX) injection  40 mg Subcutaneous Q24H  . finasteride  5 mg Oral Daily  . fluticasone-salmeterol  2 puff Inhalation BID  .  furosemide  40 mg Oral BID  . ipratropium  0.5 mg Nebulization Once  . ipratropium  0.5 mg Nebulization Q4H  . levalbuterol  0.63 mg Nebulization Q4H  . methylPREDNISolone (SOLU-MEDROL) injection  60 mg Intravenous Q8H  . methylPREDNISolone sodium succinate      . pantoprazole  40 mg Oral Q1200  . sodium chloride  500 mL Intravenous Once  . sodium chloride  3 mL Intravenous Q12H  . sodium chloride  3 mL Intravenous Q12H  . Tamsulosin HCl  0.4 mg Oral QPC breakfast  . DISCONTD: azithromycin  500 mg Intravenous Q24H  . DISCONTD: cefTRIAXone (ROCEPHIN)  IV  1 g Intravenous Q24H  . DISCONTD: fluticasone-salmeterol  2 puff Inhalation BID   Continuous Infusions:  PRN Meds:.acetaminophen, acetaminophen, guaiFENesin, levalbuterol, morphine injection, ondansetron (ZOFRAN) IV, ondansetron   Physical Exam: Filed Vitals:   11/05/11 0642  BP: 129/56  Pulse: 70  Temp: 98.3 F (36.8 C)  Resp: 16        Assessment/Plan: 1. COPD exacerbation - patient doing well. NO eveidence of respiratory infection. Breathing is much better. Plan - d/c/ home   Change from combivent to home nebulizer with duoNebs qid - [facility] provider.  Dictated # [id number]   Illene Regulus [facility] Call-grp - [facility] (o)  [phone]; (c) [phone]  11/05/2011, 9:11 AM

## 2011-11-05 NOTE — Progress Notes (Signed)
CSW received call from unit secretary that patient/patient's grandson would like information re: Advance Directives. CSW provided Advance Directives packet and instructed them to review and CSW will check back in the morning to complete.   Unice Bailey, LCSW [facility] Clinical Social Worker cell #: [phone]

## 2011-11-05 NOTE — Progress Notes (Signed)
Home set up for nebulizer treatments; pt and grandson stated understanding

## 2011-11-05 NOTE — Evaluation (Signed)
Physical Therapy One time Evaluation Patient Details Name: [name] MRN: [id number] DOB: 03/17/1927 Today's Date: 11/05/2011 Time: 4098-1191 PT Time Calculation (min): 13 min  PT Assessment / Plan / Recommendation Clinical Impression  Pt admitted for SOB.  Pt on 2L oxygen on entering room.  SaO2 on room air 98% at rest and 95% room air during ambulation.  Pt did report mild SOB with activity, so encouraged rest breaks as pt reports d/c home today.  Pt also reports grandson has RW and he will have grandson bring with him upon d/c.  No further PT needs identifed at this time.    PT Assessment  Patent does not need any further PT services    Follow Up Recommendations  No PT follow up    Barriers to Discharge        Equipment Recommendations  None recommended by PT    Recommendations for Other Services     Frequency      Precautions / Restrictions Precautions Precautions: None Restrictions Weight Bearing Restrictions: No   Pertinent Vitals/Pain No pain, SaO2 95% room air during ambulation      Mobility  Bed Mobility Bed Mobility: Supine to Sit Supine to Sit: 6: Modified independent (Device/Increase time) Transfers Transfers: Sit to Stand;Stand to Sit Sit to Stand: 5: Supervision Stand to Sit: 5: Supervision Ambulation/Gait Ambulation/Gait Assistance: 5: Supervision Ambulation Distance (Feet): 120 Feet Assistive device: Rolling walker Ambulation/Gait Assistance Details: verbal cue for posture and safe RW distance, SaO2 95% room air, pt reports slight dyspnea with ambulation, encouraged taking frequent rest breaks at home Gait Pattern: Step-through pattern;Trunk flexed Gait velocity: decreased    Exercises     PT Diagnosis:    PT Problem List:   PT Treatment Interventions:     PT Goals    Visit Information  Last PT Received On: 11/05/11 Assistance Needed: +1    Subjective Data  Subjective: Do you have a walker?   Prior Functioning  Home  Living Lives With: Alone Type of Home: Apartment Home Access: Level entry Home Layout: One level Home Adaptive Equipment: Straight cane Additional Comments: Pt reports grandson has RW and he will ask him to bring it upon d/c (today) Prior Function Level of Independence: Independent with assistive device(s) Communication Communication: No difficulties    Cognition  Overall Cognitive Status: Appears within functional limits for tasks assessed/performed Arousal/Alertness: Awake/alert Orientation Level: Appears intact for tasks assessed Behavior During Session: [facility] for tasks performed    Extremity/Trunk Assessment     Balance    End of Session PT - End of Session Activity Tolerance: Patient tolerated treatment well Patient left: in bed;with call bell/phone within reach  GP     [place] Zapien,KATHrine E 11/05/2011, 11:19 AM Pager: [phone]

## 2011-11-05 NOTE — Progress Notes (Signed)
CSW completed Living Will & Healthcare Power of Attorney, placed copy on shadow chart. Patient set to discharge home today.   Unice Bailey, LCSW [facility] Clinical Social Worker cell #: [phone]

## 2011-11-05 NOTE — Progress Notes (Signed)
Received orders for home nebulizer, talked to patient about DME, patient and son request [facility] to provide home nebulizer machine; information faxed to [phone] as requested; B Ave Filter RN, BSN, [place].

## 2011-11-05 NOTE — Progress Notes (Signed)
Received prescription for Duonebs - faxed to [facility]; B Deniyah Dillavou RN,BSN,MHA

## 2011-11-05 NOTE — Progress Notes (Signed)
Pt discharged home via family; Pt and family given and explained all discharge instructions, carenotes, and prescriptions; pt and family stated understanding and denied questions/concerns; all f/u appointments in place; IV removed without complicaitons; pt stable at time of discharge  

## 2011-11-06 NOTE — Discharge Summary (Signed)
[name], [name] NO.:  [id number]  MEDICAL RECORD NO.:  [id number]  LOCATION:  [id number]                         FACILITY:  [facility]  PHYSICIAN:  [name]. Kanoelani Dobies, MD  DATE OF BIRTH:  06-18-1926  DATE OF ADMISSION:  11/04/2011 DATE OF DISCHARGE:  11/05/2011                              DISCHARGE SUMMARY   ADMITTING DIAGNOSES: 1. Community-acquired pneumonia. 2. Chronic obstructive pulmonary disease. 3. Paroxysmal atrial fibrillation. 4. Chronic anemia. 5. History of chronic diastolic heart failure, stable.  DISCHARGE DIAGNOSES: 1. Community-acquired pneumonia. 2. Chronic obstructive pulmonary disease. 3. Paroxysmal atrial fibrillation. 4. Chronic anemia. 5. History of chronic diastolic heart failure, stable.  CONSULTANTS:  None.  PROCEDURES:  The patient had a chest x-ray at the day of admission, which was read out as showing stable hyperinflation and chronic mild interstitial prominence.  Expansile lesion of the right seventh rib is stable.  There is a small left pleural effusion with streaky basilar atelectasis versus infiltrate.  HISTORY OF PRESENT ILLNESS:  [name] is a 78 year old gentleman with a history of AFib, COPD, CHF, and anemia as well as monoclonal gammopathy who was in his usual state of health.  He had been using Combivent metered dose inhaler on a regular basis.  Combivent was taken off the market, replaced by a new delivery system called Respimat.  The patient is unable to operate this equipment and had not been able to use his Combivent for several days.  On the morning of admission, he awoke more short of breath than usual with a cough and because of his shortness of breath, was brought to the emergency department for evaluation.  In the ED, he was found to have streaky infiltrate on chest x-ray, and was thought to have a community-acquired pneumonia.  The patient subsequently admitted for antibiotic therapy. Please see the H and  P, as well as other EMR records for past history, family history, social history, and admission physical exam.  HOSPITAL COURSE: 1. Community-acquired pneumonia.  The patient at admission, but was     afebrile, white count was normal.  Chest x-ray was equivocal.  The     patient had a cough, but no sputum production.  He continued to do     well, was markedly improved with resumption of his normal breathing     treatments.  He had no continued temperature.  At this point, I     feel that there is no evidence to support an active diagnosis of     pneumonia. 2. Chronic obstructive pulmonary disease.  Patient with chronic     obstructive pulmonary disease on both Advair Diskus and also was     using Combivent metered dose inhaler.  His respiratory distress was     the result of him being unable to use his Combivent.  PLAN:  The patient will be discharged home.  We will continue him on Advair.  We will arrange for limb care, home health to provide the patient with a home nebulizer or he can continue with DuoNeb treatments 4 times a day.  The patient will continue all of his other medications.  DISCHARGE PHYSICAL EXAMINATION:  VITAL SIGNS:  Temperature was 98.3, blood pressure 129/56, pulse was 70, respirations 16, oxygen saturation was 100% on 2 L. GENERAL APPEARANCE:  This is an elderly, thin African American gentleman who is in no acute distress. HEENT:  Reveal conjunctivae and sclerae to be clear. NECK:  Supple. CHEST:  The patient has increased AP diameter.  He has decreased breath sounds.  He had no rales or wheezes.  He had no increased work of breathing. CARDIOVASCULAR:  2+ radial pulses.  Precordium was quiet.  He had an irregular heart rate that was well controlled. NEUROLOGIC:  The patient is awake, he is alert, oriented to person, place, time, context, and examiner.  FINAL LABORATORY DATA:  CMET from the day of discharge with a sodium 136, potassium 4.0, chloride 103, CO2  of 23, BUN 15, creatinine 0.79. Liver functions were normal.  Glucose was 163.  Cardiac enzymes were negative x3.  CBC for the day of discharge with a white count of 6600, hemoglobin 9.9 g, platelet count 132,000.  TSH this admission was 0.74. No repeat imaging was done.  DISPOSITION:  The patient will be discharged to home.  He will resume his home medications without change except discontinue Combivent and to start on home nebulizer treatments with DuoNebs q.i.d. Disposition, the patient will be seen in the office for followup in 1 week.  Patient's condition at time of discharge dictation is stable and improved.     [name] Rafe Mackowski, MD     MEN/MEDQ  D:  11/05/2011  T:  11/06/2011  Job:  [id number]

## 2011-11-11 ENCOUNTER — Other Ambulatory Visit: Payer: Self-pay | Admitting: Internal Medicine

## 2011-11-11 NOTE — Telephone Encounter (Signed)
May have advair 250/50 diskus 1 inhalation AM and HS - two left on Sue's desk.  Should be using DuoNeb nebulizer treatments at home.

## 2011-11-11 NOTE — Telephone Encounter (Signed)
Caller: Alan/Child; Phone: [phone]; Reason for Call: Please can son re refill on Advair; insurance will not pay until September (pt tried Combivent but couldn't take, used more Advair).  They will, however,  approve different type of Advair; please call to advise.

## 2011-11-12 MED ORDER — FLUTICASONE-SALMETEROL 115-21 MCG/ACT IN AERO: 2.0000 | INHALATION_SPRAY | Freq: Two times a day (BID) | RESPIRATORY_TRACT | Status: DC

## 2011-11-12 NOTE — Telephone Encounter (Signed)
Spoke with Hessie Diener. Informed to come to front desk at our office to pick up samples of Advair Diskus inhaler. States patient is using nebulizer also

## 2011-11-16 ENCOUNTER — Ambulatory Visit (INDEPENDENT_AMBULATORY_CARE_PROVIDER_SITE_OTHER): Payer: PRIVATE HEALTH INSURANCE | Admitting: Internal Medicine

## 2011-11-16 VITALS — BP 126/62 | HR 82 | Temp 96.3°F | Resp 16 | Wt 101.0 lb

## 2011-11-16 DIAGNOSIS — J449 Chronic obstructive pulmonary disease, unspecified: Secondary | ICD-10-CM

## 2011-11-16 DIAGNOSIS — I10 Essential (primary) hypertension: Secondary | ICD-10-CM

## 2011-11-16 MED ORDER — ALENDRONATE SODIUM 70 MG PO TABS: 70.0000 mg | ORAL_TABLET | ORAL | Status: DC

## 2011-11-16 MED ORDER — ALBUTEROL SULFATE HFA 108 (90 BASE) MCG/ACT IN AERS: 2.0000 | INHALATION_SPRAY | Freq: Four times a day (QID) | RESPIRATORY_TRACT | Status: DC | PRN

## 2011-11-16 NOTE — Patient Instructions (Addendum)
Keep on keeping on. Your lungs sound very clear - no wheezing.  Plan - continue all your present medications  Have added a ProAir inhaler to use as a rescue drug for break through wheezing. If you have to use this more than twice a week call me.   If you have to use ProAir more three times a day you need to be seen immediately.   Continue all your other medications.
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
## 2011-11-18 ENCOUNTER — Telehealth: Payer: Self-pay

## 2011-11-18 ENCOUNTER — Ambulatory Visit (INDEPENDENT_AMBULATORY_CARE_PROVIDER_SITE_OTHER): Payer: PRIVATE HEALTH INSURANCE | Admitting: Internal Medicine

## 2011-11-18 ENCOUNTER — Encounter: Payer: Self-pay | Admitting: Internal Medicine

## 2011-11-18 VITALS — BP 118/60 | HR 95 | Temp 98.4°F | Resp 16 | Wt 139.0 lb

## 2011-11-18 DIAGNOSIS — R609 Edema, unspecified: Secondary | ICD-10-CM

## 2011-11-18 DIAGNOSIS — R600 Localized edema: Secondary | ICD-10-CM

## 2011-11-18 NOTE — Telephone Encounter (Signed)
Pt's grandson called stating that [facility] visited with pt and noted fluid on his lungs and fluid retention in BLE. [name] is concerned since pt was recently seen by [facility] and this was not discussed. [name] is requesting OV ASAP, please advise

## 2011-11-18 NOTE — Telephone Encounter (Signed)
[facility] NP called: she was making home visit and made this report. Pateint already scheduled for this pm.

## 2011-11-18 NOTE — Assessment & Plan Note (Signed)
BP Readings from Last 3 Encounters:  11/16/11 126/62  11/05/11 129/56  10/12/11 102/58   Good control on present medications

## 2011-11-18 NOTE — Assessment & Plan Note (Signed)
Doing well with no respiratory complaints. He is using duoneb treatment 4 times a day and continues with his Advair. He does not require O2 therapy.  Plan Continue present regimen  Rx for albuterol rescue inhaler: if needed more than twice a week he will need med adjustment; if needed more than 3 times/day he needs to bee seen.

## 2011-11-19 ENCOUNTER — Telehealth: Payer: Self-pay | Admitting: Internal Medicine

## 2011-11-19 NOTE — Telephone Encounter (Signed)
I left a message for [name] to call.

## 2011-11-19 NOTE — Telephone Encounter (Signed)
They are unable to contact pt and she is concerned and she will fax over notes of her findings

## 2011-11-28 ENCOUNTER — Other Ambulatory Visit: Payer: Self-pay | Admitting: Internal Medicine

## 2011-12-19 ENCOUNTER — Other Ambulatory Visit: Payer: Self-pay | Admitting: Internal Medicine

## 2011-12-20 NOTE — Progress Notes (Signed)
  Subjective:    Patient ID: [name], male    DOB: 06-Sep-1926, 78 y.o.   MRN: [id number]  HPI Patient seen Aug 27th -see note. [facility] NP saw patient and was alarmed by LE edema and ? Pulmonary edema - patient added on as urgent visit. He has not c/o increased SOB, no chest pain. He does sit a lot with legs dependent and does today have increased LE edema  PMH, FamHx and SocHx reviewed for any changes and relevance. meds - no change from 27th   Review of Systems System review is negative for any constitutional, cardiac, pulmonary, GI or neuro symptoms or complaints other than as described in the HPI.     Objective:   Physical Exam Filed Vitals:   11/18/11 1647  BP: 118/60  Pulse: 95  Temp: 98.4 F (36.9 C)  Resp: 16   Gen'l - elderly AA man in no distress Cor- RRR Pulm - no increased WOB Ext- 2+ edema to below the knee       Assessment & Plan:  Edema LE - no evidence of aggravated CHF.  Plan Elevate legs  ACE wraps to legs in AM, take off at night.

## 2011-12-26 ENCOUNTER — Other Ambulatory Visit: Payer: Self-pay | Admitting: Internal Medicine

## 2012-02-01 ENCOUNTER — Ambulatory Visit (INDEPENDENT_AMBULATORY_CARE_PROVIDER_SITE_OTHER): Payer: PRIVATE HEALTH INSURANCE | Admitting: Internal Medicine

## 2012-02-01 ENCOUNTER — Encounter: Payer: Self-pay | Admitting: Internal Medicine

## 2012-02-01 VITALS — BP 114/58 | HR 101 | Temp 97.2°F | Ht 68.0 in | Wt 131.2 lb

## 2012-02-01 DIAGNOSIS — M8560 Other cyst of bone, unspecified site: Secondary | ICD-10-CM

## 2012-02-01 DIAGNOSIS — M899 Disorder of bone, unspecified: Secondary | ICD-10-CM

## 2012-02-01 DIAGNOSIS — E538 Deficiency of other specified B group vitamins: Secondary | ICD-10-CM

## 2012-02-01 DIAGNOSIS — I5032 Chronic diastolic (congestive) heart failure: Secondary | ICD-10-CM

## 2012-02-01 DIAGNOSIS — I4891 Unspecified atrial fibrillation: Secondary | ICD-10-CM

## 2012-02-01 DIAGNOSIS — D649 Anemia, unspecified: Secondary | ICD-10-CM

## 2012-02-01 DIAGNOSIS — J449 Chronic obstructive pulmonary disease, unspecified: Secondary | ICD-10-CM

## 2012-02-01 DIAGNOSIS — I10 Essential (primary) hypertension: Secondary | ICD-10-CM

## 2012-02-01 DIAGNOSIS — M8500 Fibrous dysplasia (monostotic), unspecified site: Secondary | ICD-10-CM

## 2012-02-01 MED ORDER — GUAIFENESIN 150 MG/15ML PO LIQD: 30.0000 mL | Freq: Three times a day (TID) | ORAL | Status: DC

## 2012-02-01 NOTE — Patient Instructions (Addendum)
Looking good. Don't think I can help get anything out of hock for you.  Will request a transfer of your care from Dr. Abbe Amsterdam with [facility] in [place] to the [facility] at [facility]. There may be a several week wait but once you are established I, or you, can notify Dr. Abbe Amsterdam.  For congestion Mucinex (guafenesin) is great. Will send Rx to see if we can get it cheaper on your health insurance.

## 2012-02-02 NOTE — Assessment & Plan Note (Signed)
Patient requests transfer of care from [facility] to [facility] - referral to [facility] hematology

## 2012-02-02 NOTE — Assessment & Plan Note (Signed)
BP Readings from Last 3 Encounters:  02/01/12 114/58  11/18/11 118/60  11/16/11 126/62   Good control on present regimen

## 2012-02-02 NOTE — Assessment & Plan Note (Signed)
Stable on Duonebs, Advair and prn Albuterol. Lungs are clear on exam

## 2012-02-02 NOTE — Assessment & Plan Note (Signed)
Stable with no signs of active failure

## 2012-02-02 NOTE — Assessment & Plan Note (Signed)
Getting monthly replacement. No recent B12 in our office.  Plan - he prefers to move his treatment to [place] - can do B12 in office.
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
## 2012-02-02 NOTE — Assessment & Plan Note (Signed)
On exam - good rate control

## 2012-02-03 ENCOUNTER — Telehealth: Payer: Self-pay | Admitting: Oncology

## 2012-02-03 NOTE — Telephone Encounter (Signed)
C/D 02/03/12 for appt.02/04/12

## 2012-02-03 NOTE — Telephone Encounter (Signed)
S/W pt in re NP appt 11/15 @ 10:30 w/Dr. Clelia Croft.  Referring Dr. Debby Bud Dx-Anemia, B12 def, Lytic Bone lesions on xray

## 2012-02-04 ENCOUNTER — Other Ambulatory Visit: Payer: PRIVATE HEALTH INSURANCE | Admitting: Lab

## 2012-02-04 ENCOUNTER — Other Ambulatory Visit: Payer: Self-pay | Admitting: Oncology

## 2012-02-04 ENCOUNTER — Ambulatory Visit: Payer: PRIVATE HEALTH INSURANCE

## 2012-02-04 ENCOUNTER — Ambulatory Visit: Payer: PRIVATE HEALTH INSURANCE | Admitting: Oncology

## 2012-02-04 DIAGNOSIS — D649 Anemia, unspecified: Secondary | ICD-10-CM

## 2012-02-09 ENCOUNTER — Ambulatory Visit: Payer: PRIVATE HEALTH INSURANCE

## 2012-02-09 ENCOUNTER — Encounter: Payer: Self-pay | Admitting: Oncology

## 2012-02-09 ENCOUNTER — Other Ambulatory Visit (HOSPITAL_BASED_OUTPATIENT_CLINIC_OR_DEPARTMENT_OTHER): Payer: PRIVATE HEALTH INSURANCE | Admitting: Lab

## 2012-02-09 ENCOUNTER — Telehealth: Payer: Self-pay | Admitting: Oncology

## 2012-02-09 ENCOUNTER — Ambulatory Visit (HOSPITAL_BASED_OUTPATIENT_CLINIC_OR_DEPARTMENT_OTHER): Payer: PRIVATE HEALTH INSURANCE | Admitting: Oncology

## 2012-02-09 VITALS — BP 100/60 | HR 91 | Temp 96.7°F | Resp 20 | Ht 67.0 in | Wt 129.9 lb

## 2012-02-09 DIAGNOSIS — E538 Deficiency of other specified B group vitamins: Secondary | ICD-10-CM

## 2012-02-09 DIAGNOSIS — D649 Anemia, unspecified: Secondary | ICD-10-CM

## 2012-02-09 LAB — CBC WITH DIFFERENTIAL/PLATELET
BASO%: 0.3 % (ref 0.0–2.0)
EOS%: 2 % (ref 0.0–7.0)
HCT: 35.1 % — ABNORMAL LOW (ref 38.4–49.9)
LYMPH%: 13.3 % — ABNORMAL LOW (ref 14.0–49.0)
MCH: 24 pg — ABNORMAL LOW (ref 27.2–33.4)
MCHC: 31.1 g/dL — ABNORMAL LOW (ref 32.0–36.0)
NEUT%: 78.8 % — ABNORMAL HIGH (ref 39.0–75.0)
Platelets: 158 10*3/uL (ref 140–400)

## 2012-02-09 LAB — IRON AND TIBC
%SAT: 14 % — ABNORMAL LOW (ref 20–55)
TIBC: 196 ug/dL — ABNORMAL LOW (ref 215–435)
UIBC: 168 ug/dL (ref 125–400)

## 2012-02-09 LAB — COMPREHENSIVE METABOLIC PANEL (CC13)
ALT: <6 U/L (ref 0–55)
AST: 11 U/L (ref 5–34)
CO2: 27 mEq/L (ref 22–29)
Creatinine: 0.9 mg/dL (ref 0.7–1.3)
Total Bilirubin: 0.45 mg/dL (ref 0.20–1.20)

## 2012-02-09 LAB — FERRITIN: Ferritin: 319 ng/mL (ref 22–322)
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
## 2012-02-09 NOTE — Progress Notes (Signed)
Checked in new pt with no financial concerns. °

## 2012-02-09 NOTE — Telephone Encounter (Signed)
Gave pt appt for lab , injections for December, January and see MD on February 2014

## 2012-02-11 ENCOUNTER — Ambulatory Visit: Payer: PRIVATE HEALTH INSURANCE

## 2012-02-11 ENCOUNTER — Encounter: Payer: Self-pay | Admitting: Internal Medicine

## 2012-02-11 ENCOUNTER — Ambulatory Visit (INDEPENDENT_AMBULATORY_CARE_PROVIDER_SITE_OTHER): Payer: PRIVATE HEALTH INSURANCE | Admitting: Internal Medicine

## 2012-02-11 ENCOUNTER — Other Ambulatory Visit: Payer: PRIVATE HEALTH INSURANCE | Admitting: Lab

## 2012-02-11 ENCOUNTER — Ambulatory Visit: Payer: PRIVATE HEALTH INSURANCE | Admitting: Oncology

## 2012-02-11 VITALS — BP 110/52 | HR 93 | Ht 67.0 in | Wt 129.1 lb

## 2012-02-11 DIAGNOSIS — I5032 Chronic diastolic (congestive) heart failure: Secondary | ICD-10-CM

## 2012-02-11 DIAGNOSIS — I4891 Unspecified atrial fibrillation: Secondary | ICD-10-CM
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
## 2012-02-11 NOTE — Patient Instructions (Signed)
Your physician wants you to follow-up in: 6 MONTHS WITH DR KLEIN You will receive a reminder letter in the mail two months in advance. If you don't receive a letter, please call our office to schedule the follow-up appointment.  

## 2012-02-11 NOTE — Assessment & Plan Note (Signed)
Stable  in sinus rhythm ; continue current medications

## 2012-02-11 NOTE — Assessment & Plan Note (Signed)
Holding sinus rhythm as best as we know; not on anticoagulation because of bleeding issues although he is on aspirin

## 2012-02-16 ENCOUNTER — Other Ambulatory Visit: Payer: Self-pay | Admitting: Internal Medicine

## 2012-03-10 ENCOUNTER — Ambulatory Visit (HOSPITAL_BASED_OUTPATIENT_CLINIC_OR_DEPARTMENT_OTHER): Payer: PRIVATE HEALTH INSURANCE

## 2012-03-10 ENCOUNTER — Other Ambulatory Visit (HOSPITAL_BASED_OUTPATIENT_CLINIC_OR_DEPARTMENT_OTHER): Payer: PRIVATE HEALTH INSURANCE | Admitting: Lab

## 2012-03-10 VITALS — BP 107/62 | HR 110 | Temp 96.5°F

## 2012-03-10 DIAGNOSIS — E538 Deficiency of other specified B group vitamins: Secondary | ICD-10-CM

## 2012-03-10 DIAGNOSIS — M899 Disorder of bone, unspecified: Secondary | ICD-10-CM

## 2012-03-10 DIAGNOSIS — N400 Enlarged prostate without lower urinary tract symptoms: Secondary | ICD-10-CM

## 2012-03-10 DIAGNOSIS — I071 Rheumatic tricuspid insufficiency: Secondary | ICD-10-CM

## 2012-03-10 DIAGNOSIS — I4891 Unspecified atrial fibrillation: Secondary | ICD-10-CM

## 2012-03-10 DIAGNOSIS — D649 Anemia, unspecified: Secondary | ICD-10-CM

## 2012-03-10 DIAGNOSIS — D638 Anemia in other chronic diseases classified elsewhere: Secondary | ICD-10-CM

## 2012-03-10 DIAGNOSIS — I5032 Chronic diastolic (congestive) heart failure: Secondary | ICD-10-CM

## 2012-03-10 DIAGNOSIS — N189 Chronic kidney disease, unspecified: Secondary | ICD-10-CM

## 2012-03-10 DIAGNOSIS — C61 Malignant neoplasm of prostate: Secondary | ICD-10-CM

## 2012-03-10 DIAGNOSIS — J449 Chronic obstructive pulmonary disease, unspecified: Secondary | ICD-10-CM

## 2012-03-10 DIAGNOSIS — I10 Essential (primary) hypertension: Secondary | ICD-10-CM

## 2012-03-10 DIAGNOSIS — M8500 Fibrous dysplasia (monostotic), unspecified site: Secondary | ICD-10-CM

## 2012-03-10 LAB — CBC WITH DIFFERENTIAL/PLATELET
BASO%: 0.7 % (ref 0.0–2.0)
EOS%: 4.2 % (ref 0.0–7.0)
Eosinophils Absolute: 0.3 10*3/uL (ref 0.0–0.5)
MCHC: 32.2 g/dL (ref 32.0–36.0)
MCV: 75.6 fL — ABNORMAL LOW (ref 79.3–98.0)
MONO%: 6.2 % (ref 0.0–14.0)
NEUT#: 5.1 10*3/uL (ref 1.5–6.5)
RBC: 4.16 10*6/uL — ABNORMAL LOW (ref 4.20–5.82)
RDW: 19.9 % — ABNORMAL HIGH (ref 11.0–14.6)

## 2012-03-10 MED ORDER — DARBEPOETIN ALFA-POLYSORBATE 300 MCG/0.6ML IJ SOLN
300.0000 ug | Freq: Once | INTRAMUSCULAR | Status: AC
  Administered 2012-03-10: 300 ug via SUBCUTANEOUS
  Filled 2012-03-10: qty 0.6

## 2012-04-11 ENCOUNTER — Ambulatory Visit: Payer: PRIVATE HEALTH INSURANCE

## 2012-04-11 ENCOUNTER — Other Ambulatory Visit (HOSPITAL_BASED_OUTPATIENT_CLINIC_OR_DEPARTMENT_OTHER): Payer: PRIVATE HEALTH INSURANCE | Admitting: Lab

## 2012-04-11 ENCOUNTER — Ambulatory Visit (HOSPITAL_BASED_OUTPATIENT_CLINIC_OR_DEPARTMENT_OTHER): Payer: PRIVATE HEALTH INSURANCE

## 2012-04-11 VITALS — BP 96/56 | HR 91 | Temp 96.6°F

## 2012-04-11 DIAGNOSIS — C61 Malignant neoplasm of prostate: Secondary | ICD-10-CM

## 2012-04-11 DIAGNOSIS — I4891 Unspecified atrial fibrillation: Secondary | ICD-10-CM

## 2012-04-11 DIAGNOSIS — D649 Anemia, unspecified: Secondary | ICD-10-CM

## 2012-04-11 DIAGNOSIS — M8500 Fibrous dysplasia (monostotic), unspecified site: Secondary | ICD-10-CM

## 2012-04-11 DIAGNOSIS — I071 Rheumatic tricuspid insufficiency: Secondary | ICD-10-CM

## 2012-04-11 DIAGNOSIS — N400 Enlarged prostate without lower urinary tract symptoms: Secondary | ICD-10-CM

## 2012-04-11 DIAGNOSIS — M899 Disorder of bone, unspecified: Secondary | ICD-10-CM

## 2012-04-11 DIAGNOSIS — N189 Chronic kidney disease, unspecified: Secondary | ICD-10-CM

## 2012-04-11 DIAGNOSIS — D638 Anemia in other chronic diseases classified elsewhere: Secondary | ICD-10-CM

## 2012-04-11 DIAGNOSIS — I5032 Chronic diastolic (congestive) heart failure: Secondary | ICD-10-CM

## 2012-04-11 DIAGNOSIS — E538 Deficiency of other specified B group vitamins: Secondary | ICD-10-CM

## 2012-04-11 DIAGNOSIS — I10 Essential (primary) hypertension: Secondary | ICD-10-CM

## 2012-04-11 DIAGNOSIS — J449 Chronic obstructive pulmonary disease, unspecified: Secondary | ICD-10-CM

## 2012-04-11 LAB — COMPREHENSIVE METABOLIC PANEL (CC13)
ALT: 7 U/L (ref 0–55)
AST: 12 U/L (ref 5–34)
Albumin: 3 g/dL — ABNORMAL LOW (ref 3.5–5.0)
Alkaline Phosphatase: 60 U/L (ref 40–150)
Potassium: 3.9 mEq/L (ref 3.5–5.1)
Sodium: 142 mEq/L (ref 136–145)
Total Bilirubin: 0.37 mg/dL (ref 0.20–1.20)
Total Protein: 7 g/dL (ref 6.4–8.3)

## 2012-04-11 LAB — CBC WITH DIFFERENTIAL/PLATELET
BASO%: 0.8 % (ref 0.0–2.0)
EOS%: 3.5 % (ref 0.0–7.0)
Eosinophils Absolute: 0.2 10*3/uL (ref 0.0–0.5)
LYMPH%: 14 % (ref 14.0–49.0)
MCH: 23.7 pg — ABNORMAL LOW (ref 27.2–33.4)
MCHC: 32.2 g/dL (ref 32.0–36.0)
MCV: 73.6 fL — ABNORMAL LOW (ref 79.3–98.0)
MONO%: 6.6 % (ref 0.0–14.0)
NEUT#: 5 10*3/uL (ref 1.5–6.5)
Platelets: 152 10*3/uL (ref 140–400)
RBC: 4.41 10*6/uL (ref 4.20–5.82)
RDW: 20.7 % — ABNORMAL HIGH (ref 11.0–14.6)

## 2012-04-11 MED ORDER — DARBEPOETIN ALFA-POLYSORBATE 300 MCG/0.6ML IJ SOLN
300.0000 ug | Freq: Once | INTRAMUSCULAR | Status: AC
  Administered 2012-04-11: 300 ug via SUBCUTANEOUS
  Filled 2012-04-11: qty 0.6

## 2012-04-13 LAB — SPEP & IFE WITH QIG
Albumin ELP: 48.7 % — ABNORMAL LOW (ref 55.8–66.1)
Alpha-1-Globulin: 6 % — ABNORMAL HIGH (ref 2.9–4.9)
Beta 2: 15.5 % — ABNORMAL HIGH (ref 3.2–6.5)
Beta Globulin: 7.6 % — ABNORMAL HIGH (ref 4.7–7.2)
Total Protein, Serum Electrophoresis: 7 g/dL (ref 6.0–8.3)

## 2012-04-25 ENCOUNTER — Emergency Department (HOSPITAL_COMMUNITY): Payer: PRIVATE HEALTH INSURANCE

## 2012-04-25 ENCOUNTER — Encounter (HOSPITAL_COMMUNITY): Payer: Self-pay | Admitting: Emergency Medicine

## 2012-04-25 ENCOUNTER — Emergency Department (HOSPITAL_COMMUNITY)
Admission: EM | Admit: 2012-04-25 | Discharge: 2012-04-25 | Disposition: A | Discharge status: Home / Self Care | Payer: PRIVATE HEALTH INSURANCE | Attending: Emergency Medicine | Admitting: Emergency Medicine

## 2012-04-25 DIAGNOSIS — Z9889 Other specified postprocedural states: Secondary | ICD-10-CM | POA: Insufficient documentation

## 2012-04-25 DIAGNOSIS — R05 Cough: Secondary | ICD-10-CM | POA: Insufficient documentation

## 2012-04-25 DIAGNOSIS — R0789 Other chest pain: Secondary | ICD-10-CM | POA: Insufficient documentation

## 2012-04-25 DIAGNOSIS — J438 Other emphysema: Secondary | ICD-10-CM | POA: Insufficient documentation

## 2012-04-25 DIAGNOSIS — Z79899 Other long term (current) drug therapy: Secondary | ICD-10-CM | POA: Insufficient documentation

## 2012-04-25 DIAGNOSIS — R059 Cough, unspecified: Secondary | ICD-10-CM | POA: Insufficient documentation

## 2012-04-25 DIAGNOSIS — Z87828 Personal history of other (healed) physical injury and trauma: Secondary | ICD-10-CM | POA: Insufficient documentation

## 2012-04-25 DIAGNOSIS — Z862 Personal history of diseases of the blood and blood-forming organs and certain disorders involving the immune mechanism: Secondary | ICD-10-CM | POA: Insufficient documentation

## 2012-04-25 DIAGNOSIS — Z8739 Personal history of other diseases of the musculoskeletal system and connective tissue: Secondary | ICD-10-CM | POA: Insufficient documentation

## 2012-04-25 DIAGNOSIS — J40 Bronchitis, not specified as acute or chronic: Secondary | ICD-10-CM

## 2012-04-25 DIAGNOSIS — Z8679 Personal history of other diseases of the circulatory system: Secondary | ICD-10-CM | POA: Insufficient documentation

## 2012-04-25 DIAGNOSIS — Z872 Personal history of diseases of the skin and subcutaneous tissue: Secondary | ICD-10-CM | POA: Insufficient documentation

## 2012-04-25 DIAGNOSIS — J209 Acute bronchitis, unspecified: Secondary | ICD-10-CM | POA: Insufficient documentation

## 2012-04-25 DIAGNOSIS — Z87891 Personal history of nicotine dependence: Secondary | ICD-10-CM | POA: Insufficient documentation

## 2012-04-25 DIAGNOSIS — IMO0002 Reserved for concepts with insufficient information to code with codable children: Secondary | ICD-10-CM | POA: Insufficient documentation

## 2012-04-25 DIAGNOSIS — I5032 Chronic diastolic (congestive) heart failure: Secondary | ICD-10-CM | POA: Insufficient documentation

## 2012-04-25 DIAGNOSIS — Z8546 Personal history of malignant neoplasm of prostate: Secondary | ICD-10-CM | POA: Insufficient documentation

## 2012-04-25 DIAGNOSIS — R5381 Other malaise: Secondary | ICD-10-CM | POA: Insufficient documentation

## 2012-04-25 DIAGNOSIS — I4891 Unspecified atrial fibrillation: Secondary | ICD-10-CM | POA: Insufficient documentation

## 2012-04-25 DIAGNOSIS — Z7982 Long term (current) use of aspirin: Secondary | ICD-10-CM | POA: Insufficient documentation

## 2012-04-25 LAB — BASIC METABOLIC PANEL
BUN: 13 mg/dL (ref 6–23)
CO2: 24 mEq/L (ref 19–32)
Chloride: 105 mEq/L (ref 96–112)
Creatinine, Ser: 0.65 mg/dL (ref 0.50–1.35)
GFR calc Af Amer: >90 mL/min (ref >90)

## 2012-04-25 LAB — CBC WITH DIFFERENTIAL/PLATELET
Eosinophils Relative: 5 % (ref 0–5)
Lymphs Abs: 1 10*3/uL (ref 0.7–4.0)
MCH: 22.9 pg — ABNORMAL LOW (ref 26.0–34.0)
MCV: 74.1 fL — ABNORMAL LOW (ref 78.0–100.0)
Monocytes Absolute: 0.4 10*3/uL (ref 0.1–1.0)
Platelets: 173 10*3/uL (ref 150–400)
RBC: 4.32 MIL/uL (ref 4.22–5.81)

## 2012-04-25 MED ORDER — METHYLPREDNISOLONE SODIUM SUCC 125 MG IJ SOLR
125.0000 mg | Freq: Once | INTRAMUSCULAR | Status: AC
  Administered 2012-04-25: 125 mg via INTRAVENOUS
  Filled 2012-04-25: qty 2

## 2012-04-25 MED ORDER — PREDNISONE 20 MG PO TABS: ORAL_TABLET | ORAL | Status: DC

## 2012-04-25 MED ORDER — AMOXICILLIN-POT CLAVULANATE 875-125 MG PO TABS: 1.0000 | ORAL_TABLET | Freq: Two times a day (BID) | ORAL | Status: DC

## 2012-04-25 MED ORDER — IPRATROPIUM BROMIDE 0.02 % IN SOLN
0.5000 mg | RESPIRATORY_TRACT | Status: DC
  Administered 2012-04-25: 0.5 mg via RESPIRATORY_TRACT
  Filled 2012-04-25 (×2): qty 2.5

## 2012-04-25 MED ORDER — FUROSEMIDE 10 MG/ML IJ SOLN
40.0000 mg | Freq: Once | INTRAMUSCULAR | Status: AC
  Administered 2012-04-25: 40 mg via INTRAVENOUS
  Filled 2012-04-25: qty 4

## 2012-04-25 MED ORDER — ALBUTEROL SULFATE (5 MG/ML) 0.5% IN NEBU
2.5000 mg | INHALATION_SOLUTION | RESPIRATORY_TRACT | Status: DC
  Administered 2012-04-25: 2.5 mg via RESPIRATORY_TRACT
  Filled 2012-04-25 (×2): qty 0.5

## 2012-04-25 NOTE — ED Notes (Signed)
Pt reports shortness of breath that started around noon yesterday. Pt c/o being congested with a productive cough for a few weeks, cough produces yellowish sputum. Pt has moderate swelling swelling noted to bilateral lower extremities.
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
## 2012-04-27 ENCOUNTER — Telehealth: Payer: Self-pay | Admitting: Internal Medicine

## 2012-04-27 NOTE — Telephone Encounter (Signed)
New Problem:   Called in wanting to know what the patient's latest Ejection Fraction.  Please either call or fax back. Fax: [phone]

## 2012-04-27 NOTE — Telephone Encounter (Signed)
I called [employer] at the number listed below and left a message on the confidential voice mail of the nurse- March 2012 55-60 %.

## 2012-05-02 LAB — CULTURE, BLOOD (ROUTINE X 2): Culture: NO GROWTH

## 2012-05-06 ENCOUNTER — Other Ambulatory Visit: Payer: Self-pay | Admitting: Internal Medicine

## 2012-05-08 ENCOUNTER — Other Ambulatory Visit: Payer: Self-pay | Admitting: *Deleted

## 2012-05-08 MED ORDER — ALBUTEROL SULFATE HFA 108 (90 BASE) MCG/ACT IN AERS: 2.0000 | INHALATION_SPRAY | Freq: Four times a day (QID) | RESPIRATORY_TRACT | Status: DC | PRN

## 2012-05-09 ENCOUNTER — Ambulatory Visit (INDEPENDENT_AMBULATORY_CARE_PROVIDER_SITE_OTHER): Payer: PRIVATE HEALTH INSURANCE | Admitting: Internal Medicine

## 2012-05-09 ENCOUNTER — Encounter: Payer: Self-pay | Admitting: Internal Medicine

## 2012-05-09 VITALS — BP 118/62 | HR 96 | Temp 97.6°F | Ht 67.0 in | Wt 130.0 lb

## 2012-05-09 DIAGNOSIS — J069 Acute upper respiratory infection, unspecified: Secondary | ICD-10-CM

## 2012-05-09 MED ORDER — ALBUTEROL SULFATE HFA 108 (90 BASE) MCG/ACT IN AERS: 2.0000 | INHALATION_SPRAY | Freq: Four times a day (QID) | RESPIRATORY_TRACT | Status: DC | PRN

## 2012-05-09 MED ORDER — AZITHROMYCIN 250 MG PO TABS: ORAL_TABLET | ORAL | Status: DC

## 2012-05-09 NOTE — Progress Notes (Signed)
HPI  Pt presents to the clinic today with c/o cold symptoms x 2 weeks. The worst part is the sore throat and dry cough. He does not produce any sputum. He was running fevers last week but none this week. He has tried Robitussin, Mucinex, cough drops and nothing seems to help.  He does have sick contacts.  Review of Systems      Past Medical History  Diagnosis Date  . Atrial fibrillation   . Heart failure, diastolic, chronic     EF 55-60% echo 2012  . Diagnosis unknown     lytic lesions-spine-biopsy negative?  . Prostate cancer   . Tricuspid regurgitation     pulmonary hypertension and mitral regurgitation  . Anemia, iron deficiency   . Measles   . Chronic obstructive pulmonary emphysema   . Fibrous dysplasia of bone     Bone scan Sept '11 - no mets; skeletal survey Sept '11  . CHF (congestive heart failure)     Family History  Problem Relation Age of Onset  . Heart attack Father   . Heart disease Father   . Cancer Daughter     breast  . Diabetes Daughter   . Heart disease Mother   . Heart disease Brother     heart failure  . COPD Brother     History   Social History  . Marital Status: Single    Spouse Name: N/A    Number of Children: 2  . Years of Education: 11   Occupational History  . maintenance     retired   Social History Main Topics  . Smoking status: Former Smoker -- 0.50 packs/day for 60 years    Types: Cigarettes    Quit date: 07/08/2003  . Smokeless tobacco: Never Used  . Alcohol Use: Yes     Comment: heavy drinker, quit '07  . Drug Use: No  . Sexually Active: Not Currently   Other Topics Concern  . Not on file   Social History Narrative   Finished 11th grade. Married  '55 - 10 yrs/divorced; married '84 - 8 yrs/divorced. 2 dtrs -'62, '52 4 grandchildren, 6 great-grands. 1 great-great grand. Work - Oceanographer.  Lives alone - [place]. End of life care: full code including intubation.     No Known  Allergies   Constitutional: Positive headache, fatigue and fever. Denies abrupt weight changes.  HEENT:  Positive sore throat. Denies eye redness, eye pain, pressure behind the eyes, facial pain, nasal congestion, ear pain, ringing in the ears, wax buildup, runny nose or bloody nose. Respiratory: Positive cough. Denies difficulty breathing or shortness of breath.  Cardiovascular: Denies chest pain, chest tightness, palpitations or swelling in the hands or feet.   No other specific complaints in a complete review of systems (except as listed in HPI above).  Objective:   BP 118/62  Pulse 96  Temp(Src) 97.6 F (36.4 C) (Oral)  Ht 5\' 7"  (1.702 m)  Wt 130 lb (58.968 kg)  BMI 20.36 kg/m2  SpO2 94% Wt Readings from Last 3 Encounters:  05/09/12 130 lb (58.968 kg)  02/11/12 129 lb 1.9 oz (58.568 kg)  02/09/12 129 lb 14.4 oz (58.922 kg)     General: Appears his stated age, well developed, well nourished in NAD. HEENT: Head: normal shape and size; Eyes: sclera white, no icterus, conjunctiva pink, PERRLA and EOMs intact; Ears: Tm's gray and intact, normal light reflex; Nose: mucosa pink and moist, septum midline; Throat/Mouth: + PND. Teeth  present, mucosa erythematous and moist, no exudate noted, no lesions or ulcerations noted.  Neck: Mild cervical lymphadenopathy. Neck supple, trachea midline. No massses, lumps or thyromegaly present.  Cardiovascular: Normal rate and rhythm. S1,S2 noted.  No murmur, rubs or gallops noted. No JVD or BLE edema. No carotid bruits noted. Pulmonary/Chest: Normal effort and positive vesicular breath sounds. No respiratory distress. No wheezes, rales or ronchi noted.      Assessment & Plan:   Upper Respiratory Infection, new onset with additional workup required:  Get some rest and drink plenty of water Do salt water gargles for the sore throat eRx for Azithromax x 5 days   RTC as needed or if symptoms persist.

## 2012-05-09 NOTE — Patient Instructions (Signed)

## 2012-05-11 ENCOUNTER — Telehealth: Payer: Self-pay | Admitting: Oncology

## 2012-05-11 ENCOUNTER — Ambulatory Visit (HOSPITAL_BASED_OUTPATIENT_CLINIC_OR_DEPARTMENT_OTHER): Payer: PRIVATE HEALTH INSURANCE | Admitting: Oncology

## 2012-05-11 ENCOUNTER — Other Ambulatory Visit: Payer: PRIVATE HEALTH INSURANCE | Admitting: Lab

## 2012-05-11 VITALS — BP 111/47 | HR 77 | Temp 97.4°F | Resp 20 | Ht 67.0 in | Wt 129.2 lb

## 2012-05-11 DIAGNOSIS — D638 Anemia in other chronic diseases classified elsewhere: Secondary | ICD-10-CM

## 2012-05-11 DIAGNOSIS — N189 Chronic kidney disease, unspecified: Secondary | ICD-10-CM

## 2012-05-11 DIAGNOSIS — C61 Malignant neoplasm of prostate: Secondary | ICD-10-CM

## 2012-05-11 DIAGNOSIS — J449 Chronic obstructive pulmonary disease, unspecified: Secondary | ICD-10-CM

## 2012-05-11 DIAGNOSIS — M8500 Fibrous dysplasia (monostotic), unspecified site: Secondary | ICD-10-CM

## 2012-05-11 DIAGNOSIS — N400 Enlarged prostate without lower urinary tract symptoms: Secondary | ICD-10-CM

## 2012-05-11 DIAGNOSIS — M899 Disorder of bone, unspecified: Secondary | ICD-10-CM

## 2012-05-11 DIAGNOSIS — I5032 Chronic diastolic (congestive) heart failure: Secondary | ICD-10-CM

## 2012-05-11 DIAGNOSIS — I10 Essential (primary) hypertension: Secondary | ICD-10-CM

## 2012-05-11 DIAGNOSIS — N289 Disorder of kidney and ureter, unspecified: Secondary | ICD-10-CM

## 2012-05-11 DIAGNOSIS — E538 Deficiency of other specified B group vitamins: Secondary | ICD-10-CM

## 2012-05-11 DIAGNOSIS — I071 Rheumatic tricuspid insufficiency: Secondary | ICD-10-CM

## 2012-05-11 DIAGNOSIS — R894 Abnormal immunological findings in specimens from other organs, systems and tissues: Secondary | ICD-10-CM

## 2012-05-11 DIAGNOSIS — M898X9 Other specified disorders of bone, unspecified site: Secondary | ICD-10-CM

## 2012-05-11 DIAGNOSIS — D649 Anemia, unspecified: Secondary | ICD-10-CM

## 2012-05-11 DIAGNOSIS — I4891 Unspecified atrial fibrillation: Secondary | ICD-10-CM

## 2012-05-11 LAB — CBC WITH DIFFERENTIAL/PLATELET
Basophils Absolute: 0.1 10*3/uL (ref 0.0–0.1)
EOS%: 2.3 % (ref 0.0–7.0)
HCT: 31.9 % — ABNORMAL LOW (ref 38.4–49.9)
HGB: 10.3 g/dL — ABNORMAL LOW (ref 13.0–17.1)
MCH: 23.5 pg — ABNORMAL LOW (ref 27.2–33.4)
MCV: 73.2 fL — ABNORMAL LOW (ref 79.3–98.0)
MONO%: 5.4 % (ref 0.0–14.0)
NEUT%: 81.4 % — ABNORMAL HIGH (ref 39.0–75.0)
Platelets: 143 10*3/uL (ref 140–400)

## 2012-05-11 MED ORDER — DARBEPOETIN ALFA-POLYSORBATE 300 MCG/0.6ML IJ SOLN
300.0000 ug | Freq: Once | INTRAMUSCULAR | Status: AC
  Administered 2012-05-11: 300 ug via SUBCUTANEOUS
  Filled 2012-05-11: qty 0.6

## 2012-05-11 NOTE — Telephone Encounter (Signed)
gv and printed pt appt schedule for March thru June °

## 2012-05-11 NOTE — Progress Notes (Signed)
Hematology and Oncology Follow Up Visit  [name] [id number] 02-02-1927 79 y.o. 05/11/2012 10:19 AM [name], MDNorins, Dave Gess, MD   Principle Diagnosis: 79 year old with: 1. Multifactorial anemia, likely due to chronic disease. Diagnosed in 2011. He was under the care of Dr. Abbe [name] in [place].  2. IgA MGUS 3. History of prostate cancer.    Prior Therapy: S/P bone marrow biopsy in 10/2009. Id did not show MDS or plasma cell disorder.   Current therapy: Aransep 300 mcg every  Four weeks to keep hemoglobin above 11.   Interim History:  [name] is a an 79 year old man, I saw in 01/2012 to establish care in [place]. He is a nice man with above issues. Since his last visit he has been doing well. He was diagnosed with URI and was given antibiotics. He still congested and coughing but reports no fevers or difficulty breathing. No other complications noted related to Aranesp or anemia.    Medications: I have reviewed the patient's current medications. Current outpatient prescriptions:ADVAIR HFA 115-21 MCG/ACT inhaler, INHALE 2 PUFFS INTO THE LUNGS 2 (TWO) TIMES DAILY., Disp: 12 g, Rfl: 11;  albuterol (PROAIR HFA) 108 (90 BASE) MCG/ACT inhaler, Inhale 2 puffs into the lungs every 6 (six) hours as needed for wheezing., Disp: 8.5 each, Rfl: 2 alendronate (FOSAMAX) 70 MG tablet, Take 1 tablet (70 mg total) by mouth every 7 (seven) days. Take with a full glass of water on an empty stomach.  Taken on Mondays., Disp: 4 tablet, Rfl: 6;  aspirin EC 81 MG tablet, Take 81 mg by mouth daily., Disp: , Rfl: ;  azithromycin (ZITHROMAX) 250 MG tablet, Take 2 tablets today, then 1 tablet daily for 4 days, Disp: 6 tablet, Rfl: 0 Calcium Carbonate-Vitamin D (CALCIUM 600+D) 600-400 MG-UNIT per tablet, Take 1 tablet by mouth daily.  , Disp: , Rfl: ;  Cholecalciferol (VITAMIN D3) 1000 UNITS CAPS, Take 1 capsule by mouth daily.  , Disp: , Rfl: ;  cyanocobalamin (,VITAMIN B-12,) 1000 MCG/ML  injection, Inject 1,000 mcg into the muscle every 30 (thirty) days. , Disp: , Rfl:  diltiazem (CARDIZEM CD) 120 MG 24 hr capsule, TAKE ONE CAPSULE BY MOUTH EVERY DAY, Disp: 30 capsule, Rfl: 11;  finasteride (PROSCAR) 5 MG tablet, TAKE 1 TABLET BY MOUTH DAILY, Disp: 30 tablet, Rfl: 6;  fluticasone-salmeterol (ADVAIR HFA) 115-21 MCG/ACT inhaler, Inhale 2 puffs into the lungs 2 (two) times daily., Disp: 2 Inhaler, Rfl: 0;  furosemide (LASIX) 40 MG tablet, Take 40 mg by mouth 2 (two) times daily., Disp: , Rfl:  gabapentin (NEURONTIN) 300 MG capsule, TAKE AT BEDTIME. WE CAN INCREASE THE DOSE AS NEEDED., Disp: 30 capsule, Rfl: 11;  GuaiFENesin 150 MG/15ML LIQD, Take 30 mLs by mouth every 8 (eight) hours., Disp: 500 mL, Rfl: 3;  ipratropium-albuterol (DUONEB) 0.5-2.5 (3) MG/3ML SOLN, Take 3 mLs by nebulization 4 (four) times daily., Disp: 360 mL, Rfl: 11 predniSONE (DELTASONE) 20 MG tablet, 3 tabs po day one, then 2 tabs daily x 4 days, Disp: 11 tablet, Rfl: 0;  Tamsulosin HCl (FLOMAX) 0.4 MG CAPS, TAKE 1 CAPSULE BY MOUTH DAILY, Disp: 30 capsule, Rfl: 6 Current facility-administered medications:darbepoetin (ARANESP) injection 300 mcg, 300 mcg, Subcutaneous, Once, Dave Core, MD  Allergies: No Known Allergies  Past Medical History, Surgical history, Social history, and Family History were reviewed and updated.  Review of Systems: Constitutional:  Negative for fever, chills, night sweats, anorexia, weight loss, pain. Cardiovascular: no chest pain or  dyspnea on exertion Respiratory: negative Neurological: negative Dermatological: negative ENT: negative Skin: Negative. Gastrointestinal: negative Genito-Urinary: negative Hematological and Lymphatic: negative Breast: negative Musculoskeletal: negative Remaining ROS negative. Physical Exam: Blood pressure 111/47, pulse 77, temperature 97.4 F (36.3 C), temperature source Oral, resp. rate 20, height 5\' 7"  (1.702 m), weight 129 lb 3.2 oz (58.605  kg). ECOG: 1 General appearance: alert Head: Normocephalic, without obvious abnormality, atraumatic Neck: no adenopathy, no carotid bruit, no JVD, supple, symmetrical, trachea midline and thyroid not enlarged, symmetric, no tenderness/mass/nodules Lymph nodes: Cervical, supraclavicular, and axillary nodes normal. Heart:regular rate and rhythm, S1, S2 normal, no murmur, click, rub or gallop Lung:chest clear, no wheezing, rales, normal symmetric air entry Abdomin: soft, non-tender, without masses or organomegaly EXT:no erythema, induration, or nodules   Lab Results: Lab Results  Component Value Date   WBC 9.2 05/11/2012   HGB 10.3* 05/11/2012   HCT 31.9* 05/11/2012   MCV 73.2* 05/11/2012   PLT 143 05/11/2012     Chemistry      Component Value Date/Time   NA 139 04/25/2012 1830   NA 142 04/11/2012 0950   K 4.0 04/25/2012 1830   K 3.9 04/11/2012 0950   CL 105 04/25/2012 1830   CL 105 04/11/2012 0950   CO2 24 04/25/2012 1830   CO2 29 04/11/2012 0950   BUN 13 04/25/2012 1830   BUN 20.0 04/11/2012 0950   CREATININE 0.65 04/25/2012 1830   CREATININE 0.9 04/11/2012 0950      Component Value Date/Time   CALCIUM 8.7 04/25/2012 1830   CALCIUM 9.1 04/11/2012 0950   ALKPHOS 60 04/11/2012 0950   ALKPHOS 57 11/05/2011 0425   AST 12 04/11/2012 0950   AST 10 11/05/2011 0425   ALT 7 04/11/2012 0950   ALT 6 11/05/2011 0425   BILITOT 0.37 04/11/2012 0950   BILITOT 0.3 11/05/2011 0425        Impression and Plan:  79 year old with the following issues:  1. Multifactorial anemia: He has an element of chronic disease, renal disease and possible B12 and iron deficiency. His iron levels are little low. I discussed with him iron replacement options including IV vs PO. He have decided to proceed with IV iron in the form of feraheme. Risks and benefits discussed, he is ready to get that next week.  I will continue his Aransep on monthly basis to keep his Hgb above 11.   2. Elevated IgA in the past. I see no evidence of a  a plasma cell disorder. He was found to have a benign bone lesion in the past.   3. COPD: he is followd be [name] for that.   4. Follow up: in 4 months.    [facility], MD 2/20/201410:19 AM

## 2012-05-12 ENCOUNTER — Telehealth: Payer: Self-pay | Admitting: Oncology

## 2012-05-12 ENCOUNTER — Telehealth: Payer: Self-pay | Admitting: *Deleted

## 2012-05-12 NOTE — Telephone Encounter (Signed)
Per staff message and POF I have scheduled appts.  JMW  

## 2012-05-12 NOTE — Telephone Encounter (Signed)
lvm for pt regarding to 2.27.14 appt...pt has March appt schedule

## 2012-05-18 ENCOUNTER — Ambulatory Visit (HOSPITAL_BASED_OUTPATIENT_CLINIC_OR_DEPARTMENT_OTHER): Payer: PRIVATE HEALTH INSURANCE

## 2012-05-18 VITALS — BP 110/64 | HR 94 | Temp 97.9°F | Resp 20

## 2012-05-18 DIAGNOSIS — M8500 Fibrous dysplasia (monostotic), unspecified site: Secondary | ICD-10-CM

## 2012-05-18 DIAGNOSIS — D649 Anemia, unspecified: Secondary | ICD-10-CM

## 2012-05-18 DIAGNOSIS — M898X9 Other specified disorders of bone, unspecified site: Secondary | ICD-10-CM

## 2012-05-18 DIAGNOSIS — E538 Deficiency of other specified B group vitamins: Secondary | ICD-10-CM

## 2012-05-18 DIAGNOSIS — C61 Malignant neoplasm of prostate: Secondary | ICD-10-CM

## 2012-05-18 DIAGNOSIS — N400 Enlarged prostate without lower urinary tract symptoms: Secondary | ICD-10-CM

## 2012-05-18 DIAGNOSIS — I071 Rheumatic tricuspid insufficiency: Secondary | ICD-10-CM

## 2012-05-18 DIAGNOSIS — I4891 Unspecified atrial fibrillation: Secondary | ICD-10-CM

## 2012-05-18 DIAGNOSIS — J449 Chronic obstructive pulmonary disease, unspecified: Secondary | ICD-10-CM

## 2012-05-18 DIAGNOSIS — M899 Disorder of bone, unspecified: Secondary | ICD-10-CM

## 2012-05-18 DIAGNOSIS — I10 Essential (primary) hypertension: Secondary | ICD-10-CM

## 2012-05-18 DIAGNOSIS — I5032 Chronic diastolic (congestive) heart failure: Secondary | ICD-10-CM

## 2012-05-18 MED ORDER — FERUMOXYTOL INJECTION 510 MG/17 ML
510.0000 mg | Freq: Once | INTRAVENOUS | Status: AC
  Administered 2012-05-18: 510 mg via INTRAVENOUS
  Filled 2012-05-18: qty 17

## 2012-05-18 MED ORDER — SODIUM CHLORIDE 0.9 % IV SOLN
INTRAVENOUS | Status: AC
  Administered 2012-05-18: 10:00:00 via INTRAVENOUS

## 2012-05-18 NOTE — Patient Instructions (Signed)
Ferumoxytol injection What is this medicine? FERUMOXYTOL is an iron complex. Iron is used to make healthy red blood cells, which carry oxygen and nutrients throughout the body. This medicine is used to treat iron deficiency anemia in people with chronic kidney disease. This medicine may be used for other purposes; ask your health care provider or pharmacist if you have questions. What should I tell my health care provider before I take this medicine? They need to know if you have any of these conditions: -anemia not caused by low iron levels -high levels of iron in the blood -magnetic resonance imaging (MRI) test scheduled -an unusual or allergic reaction to iron, other medicines, foods, dyes, or preservatives -pregnant or trying to get pregnant -breast-feeding How should I use this medicine? This medicine is for infusion into a vein. It is given by a health care professional in a hospital or clinic setting. Talk to your pediatrician regarding the use of this medicine in children. Special care may be needed. Overdosage: If you think you've taken too much of this medicine contact a poison control center or emergency room at once. Overdosage: If you think you have taken too much of this medicine contact a poison control center or emergency room at once. NOTE: This medicine is only for you. Do not share this medicine with others. What if I miss a dose? It is important not to miss your dose. Call your doctor or health care professional if you are unable to keep an appointment. What may interact with this medicine? This medicine may interact with the following medications: -other iron products This list may not describe all possible interactions. Give your health care provider a list of all the medicines, herbs, non-prescription drugs, or dietary supplements you use. Also tell them if you smoke, drink alcohol, or use illegal drugs. Some items may interact with your medicine. What should I watch  for while using this medicine? Visit your doctor or healthcare professional regularly. Tell your doctor or healthcare professional if your symptoms do not start to get better or if they get worse. You may need blood work done while you are taking this medicine. You may need to follow a special diet. Talk to your doctor. Foods that contain iron include: whole grains/cereals, dried fruits, beans, or peas, leafy green vegetables, and organ meats (liver, kidney). What side effects may I notice from receiving this medicine? Side effects that you should report to your doctor or health care professional as soon as possible: -allergic reactions like skin rash, itching or hives, swelling of the face, lips, or tongue -breathing problems -changes in blood pressure -feeling faint or lightheaded, falls -fever or chills -flushing, sweating, or hot feelings -swelling of the ankles or feet Side effects that usually do not require medical attention (Report these to your doctor or health care professional if they continue or are bothersome.): -diarrhea -headache -nausea, vomiting -stomach pain This list may not describe all possible side effects. Call your doctor for medical advice about side effects. You may report side effects to FDA at 1-800-FDA-1088. Where should I keep my medicine? This drug is given in a hospital or clinic and will not be stored at home. NOTE: This sheet is a summary. It may not cover all possible information. If you have questions about this medicine, talk to your doctor, pharmacist, or health care provider.  2013, [employer]vier/[employer] Standard. (11/29/2007 9:48:25 PM)  

## 2012-05-24 ENCOUNTER — Telehealth: Payer: Self-pay | Admitting: Oncology

## 2012-05-24 NOTE — Telephone Encounter (Signed)
Gave pt appt for lab and injetions for [facility], to June 2014

## 2012-05-25 ENCOUNTER — Other Ambulatory Visit: Payer: PRIVATE HEALTH INSURANCE | Admitting: Lab

## 2012-05-25 ENCOUNTER — Ambulatory Visit: Payer: PRIVATE HEALTH INSURANCE

## 2012-05-31 ENCOUNTER — Ambulatory Visit (INDEPENDENT_AMBULATORY_CARE_PROVIDER_SITE_OTHER): Payer: PRIVATE HEALTH INSURANCE | Admitting: Internal Medicine

## 2012-05-31 ENCOUNTER — Encounter: Payer: Self-pay | Admitting: Internal Medicine

## 2012-05-31 ENCOUNTER — Other Ambulatory Visit (INDEPENDENT_AMBULATORY_CARE_PROVIDER_SITE_OTHER): Payer: PRIVATE HEALTH INSURANCE

## 2012-05-31 ENCOUNTER — Other Ambulatory Visit: Payer: Self-pay | Admitting: *Deleted

## 2012-05-31 VITALS — BP 106/60 | HR 93 | Temp 97.7°F | Resp 12 | Wt 126.2 lb

## 2012-05-31 DIAGNOSIS — J449 Chronic obstructive pulmonary disease, unspecified: Secondary | ICD-10-CM

## 2012-05-31 DIAGNOSIS — R946 Abnormal results of thyroid function studies: Secondary | ICD-10-CM

## 2012-05-31 DIAGNOSIS — I10 Essential (primary) hypertension: Secondary | ICD-10-CM

## 2012-05-31 DIAGNOSIS — I4891 Unspecified atrial fibrillation: Secondary | ICD-10-CM

## 2012-05-31 DIAGNOSIS — E538 Deficiency of other specified B group vitamins: Secondary | ICD-10-CM

## 2012-05-31 DIAGNOSIS — R7989 Other specified abnormal findings of blood chemistry: Secondary | ICD-10-CM

## 2012-05-31 DIAGNOSIS — D649 Anemia, unspecified: Secondary | ICD-10-CM

## 2012-05-31 LAB — VITAMIN B12: Vitamin B-12: 346 pg/mL (ref 211–911)

## 2012-05-31 LAB — COMPREHENSIVE METABOLIC PANEL
AST: 16 U/L (ref 0–37)
Alkaline Phosphatase: 50 U/L (ref 39–117)
BUN: 18 mg/dL (ref 6–23)
Calcium: 8.8 mg/dL (ref 8.4–10.5)
Creatinine, Ser: 1.1 mg/dL (ref 0.4–1.5)
Glucose, Bld: 105 mg/dL — ABNORMAL HIGH (ref 70–99)

## 2012-05-31 LAB — T4, FREE: Free T4: 1.15 ng/dL (ref 0.60–1.60)

## 2012-05-31 LAB — TSH: TSH: 1.83 u[IU]/mL (ref 0.35–5.50)
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
## 2012-05-31 NOTE — Patient Instructions (Addendum)
1. Heart - regular rate today. BP is just a little low but OK  2. Anemia - per Dr. Clelia Croft who is also managing the Paget's disease.  3. Weight loss - down 13 lbs since last year. CT of the chest in '12 OK, recent CXR's do reveal COPD. Thyroid labs are changed  Plan Lab today to check thyroid, B12, kidney function and blood sugar  Keep a food diary: write down everything that crosses your lips, what is it and how much, for 5 days. Throw away day 1&2 and add up all the calories    For days 3-5  OK to take a little boost or ensure.  4. Skin lesion on the left cheek: does not look like a cancer. If it is changing we can do a shave biopsy here in the office.

## 2012-06-01 NOTE — Assessment & Plan Note (Signed)
Stable rate at today's visit.  Plan Continue present medications

## 2012-06-01 NOTE — Assessment & Plan Note (Signed)
Followed by Dr. Clelia Croft. Patient reports had has recently had an iron infusion which he tolerated well.  Lab Results  Component Value Date   HGB 10.3* 05/11/2012   Plan -  Per Dr. Clelia Croft

## 2012-06-01 NOTE — Assessment & Plan Note (Signed)
BP Readings from Last 3 Encounters:  05/31/12 106/60  05/18/12 110/64  05/11/12 111/47   Stable but low BP  Plan continue present medications - helping with rate control

## 2012-06-01 NOTE — Assessment & Plan Note (Signed)
No obvious increased WOB at rest. Weight loss may be related to lung disease. O2 sat ok on room air.  Plan Continue present regimen of inhalational therapy.  Reviewed maintenance vs rescue medication  Follow up with pulmonary as instructed

## 2012-06-05 ENCOUNTER — Encounter: Payer: Self-pay | Admitting: Internal Medicine

## 2012-06-06 ENCOUNTER — Telehealth: Payer: Self-pay | Admitting: Internal Medicine

## 2012-06-06 NOTE — Telephone Encounter (Signed)
Patient Information:  Caller Name: Hessie Diener  Phone: [phone]  Patient: [name]  Gender: Male  DOB: 02-06-1927  Age: 79 Years  PCP: Illene Regulus (Adults only)  Office Follow Up:  Does the office need to follow up with this patient?: No  Instructions For The Office: N/A  RN Note:  Advised to tell pt to use rescue inhaler every 4 hrs prn for coughing, saline nasal spray and humidifier until appt on 06/07/12.  Symptoms  Reason For Call & Symptoms: Cold. Cough, runny nose. COPD. Grandaughter not with pt.  Reviewed Health History In EMR: Yes  Reviewed Medications In EMR: Yes  Reviewed Allergies In EMR: Yes  Reviewed Surgeries / Procedures: Yes  Date of Onset of Symptoms: 06/05/2012  Guideline(s) Used:  Colds  No Protocol Available - Sick Adult  Disposition Per Guideline:   See Today or Tomorrow in Office  Reason For Disposition Reached:   Nursing judgment  Advice Given:  Call Back If:  New symptoms develop  You become worse.  Patient Will Follow Care Advice:  YES  Appointment Scheduled:  06/07/2012 11:30:00 Appointment Scheduled Provider:  Illene Regulus (Adults only)

## 2012-06-07 ENCOUNTER — Ambulatory Visit: Payer: Self-pay | Admitting: Internal Medicine

## 2012-06-07 DIAGNOSIS — Z0289 Encounter for other administrative examinations: Secondary | ICD-10-CM

## 2012-06-08 ENCOUNTER — Other Ambulatory Visit (HOSPITAL_BASED_OUTPATIENT_CLINIC_OR_DEPARTMENT_OTHER): Payer: PRIVATE HEALTH INSURANCE | Admitting: Lab

## 2012-06-08 ENCOUNTER — Ambulatory Visit (HOSPITAL_BASED_OUTPATIENT_CLINIC_OR_DEPARTMENT_OTHER): Payer: PRIVATE HEALTH INSURANCE

## 2012-06-08 VITALS — BP 105/43 | HR 95 | Temp 97.4°F

## 2012-06-08 DIAGNOSIS — I10 Essential (primary) hypertension: Secondary | ICD-10-CM

## 2012-06-08 DIAGNOSIS — C61 Malignant neoplasm of prostate: Secondary | ICD-10-CM

## 2012-06-08 DIAGNOSIS — I071 Rheumatic tricuspid insufficiency: Secondary | ICD-10-CM

## 2012-06-08 DIAGNOSIS — D649 Anemia, unspecified: Secondary | ICD-10-CM

## 2012-06-08 DIAGNOSIS — N189 Chronic kidney disease, unspecified: Secondary | ICD-10-CM

## 2012-06-08 DIAGNOSIS — J449 Chronic obstructive pulmonary disease, unspecified: Secondary | ICD-10-CM

## 2012-06-08 DIAGNOSIS — M8500 Fibrous dysplasia (monostotic), unspecified site: Secondary | ICD-10-CM

## 2012-06-08 DIAGNOSIS — D638 Anemia in other chronic diseases classified elsewhere: Secondary | ICD-10-CM

## 2012-06-08 DIAGNOSIS — M899 Disorder of bone, unspecified: Secondary | ICD-10-CM

## 2012-06-08 DIAGNOSIS — E538 Deficiency of other specified B group vitamins: Secondary | ICD-10-CM

## 2012-06-08 DIAGNOSIS — N400 Enlarged prostate without lower urinary tract symptoms: Secondary | ICD-10-CM

## 2012-06-08 DIAGNOSIS — I5032 Chronic diastolic (congestive) heart failure: Secondary | ICD-10-CM

## 2012-06-08 DIAGNOSIS — I4891 Unspecified atrial fibrillation: Secondary | ICD-10-CM

## 2012-06-08 LAB — CBC WITH DIFFERENTIAL/PLATELET
BASO%: 0.9 % (ref 0.0–2.0)
Basophils Absolute: 0.1 10*3/uL (ref 0.0–0.1)
EOS%: 5.5 % (ref 0.0–7.0)
HGB: 10.4 g/dL — ABNORMAL LOW (ref 13.0–17.1)
MCH: 24.3 pg — ABNORMAL LOW (ref 27.2–33.4)
MONO%: 9.4 % (ref 0.0–14.0)
RBC: 4.28 10*6/uL (ref 4.20–5.82)
RDW: 22.4 % — ABNORMAL HIGH (ref 11.0–14.6)
lymph#: 0.9 10*3/uL (ref 0.9–3.3)

## 2012-06-08 MED ORDER — DARBEPOETIN ALFA-POLYSORBATE 300 MCG/0.6ML IJ SOLN
300.0000 ug | Freq: Once | INTRAMUSCULAR | Status: AC
  Administered 2012-06-08: 300 ug via SUBCUTANEOUS
  Filled 2012-06-08: qty 0.6

## 2012-06-08 NOTE — Patient Instructions (Addendum)
Darbepoetin Alfa injection What is this medicine? DARBEPOETIN ALFA (dar be POE e tin AL fa) helps your body make more red blood cells. It is used to treat anemia caused by chronic kidney failure and chemotherapy. This medicine may be used for other purposes; ask your health care provider or pharmacist if you have questions. What should I tell my health care provider before I take this medicine? They need to know if you have any of these conditions: -blood clotting disorders or history of blood clots -cancer patient not on chemotherapy -cystic fibrosis -heart disease, such as angina, heart failure, or a history of a heart attack -hemoglobin level of 12 g/dL or greater -high blood pressure -low levels of folate, iron, or vitamin B12 -seizures -an unusual or allergic reaction to darbepoetin, erythropoietin, albumin, hamster proteins, latex, other medicines, foods, dyes, or preservatives -pregnant or trying to get pregnant -breast-feeding How should I use this medicine? This medicine is for injection into a vein or under the skin. It is usually given by a health care professional in a hospital or clinic setting. If you get this medicine at home, you will be taught how to prepare and give this medicine. Do not shake the solution before you withdraw a dose. Use exactly as directed. Take your medicine at regular intervals. Do not take your medicine more often than directed. It is important that you put your used needles and syringes in a special sharps container. Do not put them in a trash can. If you do not have a sharps container, call your pharmacist or healthcare provider to get one. Talk to your pediatrician regarding the use of this medicine in children. While this medicine may be used in children as young as 1 year for selected conditions, precautions do apply. Overdosage: If you think you have taken too much of this medicine contact a poison control center or emergency room at once. NOTE:  This medicine is only for you. Do not share this medicine with others. What if I miss a dose? If you miss a dose, take it as soon as you can. If it is almost time for your next dose, take only that dose. Do not take double or extra doses. What may interact with this medicine? Do not take this medicine with any of the following medications: -epoetin alfa This list may not describe all possible interactions. Give your health care provider a list of all the medicines, herbs, non-prescription drugs, or dietary supplements you use. Also tell them if you smoke, drink alcohol, or use illegal drugs. Some items may interact with your medicine. What should I watch for while using this medicine? Visit your prescriber or health care professional for regular checks on your progress and for the needed blood tests and blood pressure measurements. It is especially important for the doctor to make sure your hemoglobin level is in the desired range, to limit the risk of potential side effects and to give you the best benefit. Keep all appointments for any recommended tests. Check your blood pressure as directed. Ask your doctor what your blood pressure should be and when you should contact him or her. As your body makes more red blood cells, you may need to take iron, folic acid, or vitamin B supplements. Ask your doctor or health care provider which products are right for you. If you have kidney disease continue dietary restrictions, even though this medication can make you feel better. Talk with your doctor or health care professional about the   foods you eat and the vitamins that you take. What side effects may I notice from receiving this medicine? Side effects that you should report to your doctor or health care professional as soon as possible: -allergic reactions like skin rash, itching or hives, swelling of the face, lips, or tongue -breathing problems -changes in vision -chest pain -confusion, trouble speaking  or understanding -feeling faint or lightheaded, falls -high blood pressure -muscle aches or pains -pain, swelling, warmth in the leg -rapid weight gain -severe headaches -sudden numbness or weakness of the face, arm or leg -trouble walking, dizziness, loss of balance or coordination -seizures (convulsions) -swelling of the ankles, feet, hands -unusually weak or tired Side effects that usually do not require medical attention (report to your doctor or health care professional if they continue or are bothersome): -diarrhea -fever, chills (flu-like symptoms) -headaches -nausea, vomiting -redness, stinging, or swelling at site where injected This list may not describe all possible side effects. Call your doctor for medical advice about side effects. You may report side effects to FDA at 1-800-FDA-1088. Where should I keep my medicine? Keep out of the reach of children. Store in a refrigerator between 2 and 8 degrees C (36 and 46 degrees F). Do not freeze. Do not shake. Throw away any unused portion if using a single-dose vial. Throw away any unused medicine after the expiration date. NOTE: This sheet is a summary. It may not cover all possible information. If you have questions about this medicine, talk to your doctor, pharmacist, or health care provider.  2013, [employer] Standard. (02/20/2008 10:23:57 AM)  

## 2012-06-09 ENCOUNTER — Encounter (HOSPITAL_COMMUNITY): Payer: Self-pay | Admitting: *Deleted

## 2012-06-09 ENCOUNTER — Inpatient Hospital Stay (HOSPITAL_COMMUNITY)
Admission: EM | Admit: 2012-06-09 | Discharge: 2012-06-12 | DRG: 190 | Disposition: A | Discharge status: Home Health | Payer: PRIVATE HEALTH INSURANCE | Attending: Internal Medicine | Admitting: Internal Medicine

## 2012-06-09 ENCOUNTER — Other Ambulatory Visit: Payer: Self-pay

## 2012-06-09 ENCOUNTER — Emergency Department (HOSPITAL_COMMUNITY): Payer: PRIVATE HEALTH INSURANCE

## 2012-06-09 DIAGNOSIS — N400 Enlarged prostate without lower urinary tract symptoms: Secondary | ICD-10-CM

## 2012-06-09 DIAGNOSIS — R531 Weakness: Secondary | ICD-10-CM

## 2012-06-09 DIAGNOSIS — R0902 Hypoxemia: Secondary | ICD-10-CM | POA: Diagnosis present

## 2012-06-09 DIAGNOSIS — E538 Deficiency of other specified B group vitamins: Secondary | ICD-10-CM

## 2012-06-09 DIAGNOSIS — D509 Iron deficiency anemia, unspecified: Secondary | ICD-10-CM | POA: Diagnosis present

## 2012-06-09 DIAGNOSIS — I5032 Chronic diastolic (congestive) heart failure: Secondary | ICD-10-CM

## 2012-06-09 DIAGNOSIS — R5383 Other fatigue: Secondary | ICD-10-CM | POA: Diagnosis present

## 2012-06-09 DIAGNOSIS — R5381 Other malaise: Secondary | ICD-10-CM | POA: Diagnosis present

## 2012-06-09 DIAGNOSIS — J449 Chronic obstructive pulmonary disease, unspecified: Secondary | ICD-10-CM

## 2012-06-09 DIAGNOSIS — I4891 Unspecified atrial fibrillation: Secondary | ICD-10-CM

## 2012-06-09 DIAGNOSIS — I10 Essential (primary) hypertension: Secondary | ICD-10-CM

## 2012-06-09 DIAGNOSIS — E43 Unspecified severe protein-calorie malnutrition: Secondary | ICD-10-CM

## 2012-06-09 DIAGNOSIS — R0602 Shortness of breath: Secondary | ICD-10-CM

## 2012-06-09 DIAGNOSIS — IMO0002 Reserved for concepts with insufficient information to code with codable children: Secondary | ICD-10-CM

## 2012-06-09 DIAGNOSIS — J441 Chronic obstructive pulmonary disease with (acute) exacerbation: Secondary | ICD-10-CM

## 2012-06-09 LAB — CBC
HCT: 31.5 % — ABNORMAL LOW (ref 39.0–52.0)
MCH: 24.3 pg — ABNORMAL LOW (ref 26.0–34.0)
MCHC: 31.7 g/dL (ref 30.0–36.0)
MCV: 76.6 fL — ABNORMAL LOW (ref 78.0–100.0)
RDW: 21.1 % — ABNORMAL HIGH (ref 11.5–15.5)

## 2012-06-09 LAB — BASIC METABOLIC PANEL
BUN: 15 mg/dL (ref 6–23)
Calcium: 8.4 mg/dL (ref 8.4–10.5)
Creatinine, Ser: 0.79 mg/dL (ref 0.50–1.35)
GFR calc Af Amer: >90 mL/min (ref >90)
GFR calc non Af Amer: 81 mL/min — ABNORMAL LOW (ref >90)

## 2012-06-09 MED ORDER — IPRATROPIUM BROMIDE 0.02 % IN SOLN
0.5000 mg | Freq: Once | RESPIRATORY_TRACT | Status: AC
  Administered 2012-06-09: 0.5 mg via RESPIRATORY_TRACT
  Filled 2012-06-09: qty 2.5

## 2012-06-09 MED ORDER — ACETAMINOPHEN 650 MG RE SUPP: 650.0000 mg | Freq: Four times a day (QID) | RECTAL | Status: DC | PRN

## 2012-06-09 MED ORDER — TAMSULOSIN HCL 0.4 MG PO CAPS
0.4000 mg | ORAL_CAPSULE | Freq: Every day | ORAL | Status: DC
  Administered 2012-06-09 – 2012-06-12 (×4): 0.4 mg via ORAL
  Filled 2012-06-09 (×4): qty 1

## 2012-06-09 MED ORDER — PREDNISONE 20 MG PO TABS
60.0000 mg | ORAL_TABLET | Freq: Once | ORAL | Status: AC
  Administered 2012-06-09: 60 mg via ORAL
  Filled 2012-06-09: qty 3

## 2012-06-09 MED ORDER — GUAIFENESIN 100 MG/5ML PO SOLN
300.0000 mg | Freq: Three times a day (TID) | ORAL | Status: DC
  Administered 2012-06-09 – 2012-06-12 (×8): 300 mg via ORAL
  Filled 2012-06-09 (×13): qty 15

## 2012-06-09 MED ORDER — ACETAMINOPHEN 325 MG PO TABS: 650.0000 mg | ORAL_TABLET | Freq: Four times a day (QID) | ORAL | Status: DC | PRN

## 2012-06-09 MED ORDER — ALBUTEROL SULFATE (5 MG/ML) 0.5% IN NEBU
5.0000 mg | INHALATION_SOLUTION | Freq: Once | RESPIRATORY_TRACT | Status: AC
  Administered 2012-06-09: 5 mg via RESPIRATORY_TRACT

## 2012-06-09 MED ORDER — LEVOFLOXACIN IN D5W 500 MG/100ML IV SOLN
500.0000 mg | Freq: Once | INTRAVENOUS | Status: AC
  Administered 2012-06-09: 500 mg via INTRAVENOUS
  Filled 2012-06-09: qty 100

## 2012-06-09 MED ORDER — ONDANSETRON HCL 4 MG PO TABS: 4.0000 mg | ORAL_TABLET | Freq: Four times a day (QID) | ORAL | Status: DC | PRN

## 2012-06-09 MED ORDER — FUROSEMIDE 10 MG/ML IJ SOLN
60.0000 mg | Freq: Once | INTRAMUSCULAR | Status: AC
  Administered 2012-06-09: 60 mg via INTRAVENOUS
  Filled 2012-06-09: qty 8

## 2012-06-09 MED ORDER — ALBUTEROL SULFATE (5 MG/ML) 0.5% IN NEBU: 2.5000 mg | INHALATION_SOLUTION | RESPIRATORY_TRACT | Status: DC | PRN

## 2012-06-09 MED ORDER — ASPIRIN EC 81 MG PO TBEC
81.0000 mg | DELAYED_RELEASE_TABLET | Freq: Every day | ORAL | Status: DC
  Administered 2012-06-09 – 2012-06-12 (×4): 81 mg via ORAL
  Filled 2012-06-09 (×4): qty 1

## 2012-06-09 MED ORDER — SODIUM CHLORIDE 0.9 % IJ SOLN: 3.0000 mL | INTRAMUSCULAR | Status: DC | PRN

## 2012-06-09 MED ORDER — IPRATROPIUM BROMIDE 0.02 % IN SOLN
0.5000 mg | Freq: Four times a day (QID) | RESPIRATORY_TRACT | Status: DC
  Administered 2012-06-09 – 2012-06-12 (×11): 0.5 mg via RESPIRATORY_TRACT
  Filled 2012-06-09 (×11): qty 2.5

## 2012-06-09 MED ORDER — FUROSEMIDE 40 MG PO TABS
40.0000 mg | ORAL_TABLET | Freq: Two times a day (BID) | ORAL | Status: DC
  Administered 2012-06-09 – 2012-06-12 (×6): 40 mg via ORAL
  Filled 2012-06-09 (×9): qty 1

## 2012-06-09 MED ORDER — ALBUTEROL SULFATE (5 MG/ML) 0.5% IN NEBU
2.5000 mg | INHALATION_SOLUTION | Freq: Four times a day (QID) | RESPIRATORY_TRACT | Status: DC
  Administered 2012-06-09 – 2012-06-12 (×11): 2.5 mg via RESPIRATORY_TRACT
  Filled 2012-06-09 (×11): qty 0.5

## 2012-06-09 MED ORDER — METHYLPREDNISOLONE SODIUM SUCC 125 MG IJ SOLR
60.0000 mg | Freq: Once | INTRAMUSCULAR | Status: AC
  Administered 2012-06-09: 60 mg via INTRAVENOUS
  Filled 2012-06-09: qty 2

## 2012-06-09 MED ORDER — LEVOFLOXACIN IN D5W 750 MG/150ML IV SOLN: 750.0000 mg | INTRAVENOUS | Status: DC

## 2012-06-09 MED ORDER — SODIUM CHLORIDE 0.9 % IJ SOLN
3.0000 mL | Freq: Two times a day (BID) | INTRAMUSCULAR | Status: DC
  Administered 2012-06-09 – 2012-06-11 (×4): 3 mL via INTRAVENOUS

## 2012-06-09 MED ORDER — HEPARIN SODIUM (PORCINE) 5000 UNIT/ML IJ SOLN
5000.0000 [IU] | Freq: Three times a day (TID) | INTRAMUSCULAR | Status: DC
  Administered 2012-06-09 – 2012-06-12 (×8): 5000 [IU] via SUBCUTANEOUS
  Filled 2012-06-09 (×12): qty 1

## 2012-06-09 MED ORDER — ONDANSETRON HCL 4 MG/2ML IJ SOLN: 4.0000 mg | Freq: Four times a day (QID) | INTRAMUSCULAR | Status: DC | PRN

## 2012-06-09 MED ORDER — METHYLPREDNISOLONE SODIUM SUCC 125 MG IJ SOLR
60.0000 mg | Freq: Three times a day (TID) | INTRAMUSCULAR | Status: DC
  Administered 2012-06-10 (×2): 60 mg via INTRAVENOUS
  Filled 2012-06-09 (×4): qty 0.96

## 2012-06-09 MED ORDER — ALBUTEROL SULFATE (5 MG/ML) 0.5% IN NEBU
5.0000 mg | INHALATION_SOLUTION | Freq: Once | RESPIRATORY_TRACT | Status: AC
  Administered 2012-06-09: 5 mg via RESPIRATORY_TRACT
  Filled 2012-06-09: qty 1

## 2012-06-09 MED ORDER — FINASTERIDE 5 MG PO TABS
5.0000 mg | ORAL_TABLET | Freq: Every day | ORAL | Status: DC
  Administered 2012-06-09 – 2012-06-12 (×4): 5 mg via ORAL
  Filled 2012-06-09 (×4): qty 1

## 2012-06-09 MED ORDER — DILTIAZEM HCL ER COATED BEADS 120 MG PO CP24
120.0000 mg | ORAL_CAPSULE | Freq: Every day | ORAL | Status: DC
  Administered 2012-06-09 – 2012-06-12 (×4): 120 mg via ORAL
  Filled 2012-06-09 (×4): qty 1

## 2012-06-09 MED ORDER — LEVOFLOXACIN IN D5W 750 MG/150ML IV SOLN
750.0000 mg | INTRAVENOUS | Status: DC
  Filled 2012-06-09: qty 150

## 2012-06-09 MED ORDER — SODIUM CHLORIDE 0.9 % IJ SOLN
3.0000 mL | Freq: Two times a day (BID) | INTRAMUSCULAR | Status: DC
  Administered 2012-06-10 – 2012-06-11 (×3): 3 mL via INTRAVENOUS

## 2012-06-09 MED ORDER — SODIUM CHLORIDE 0.9 % IV SOLN: 250.0000 mL | INTRAVENOUS | Status: DC | PRN

## 2012-06-09 MED ORDER — GABAPENTIN 300 MG PO CAPS
300.0000 mg | ORAL_CAPSULE | Freq: Every day | ORAL | Status: DC
  Administered 2012-06-09 – 2012-06-11 (×3): 300 mg via ORAL
  Filled 2012-06-09 (×4): qty 1
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
## 2012-06-09 NOTE — ED Notes (Signed)
Pt reports onset of SOB over past week. Dyspnea worse with exertion. Does not use oxygen at home. Hx of CHF x5 years. Denies chest pain

## 2012-06-09 NOTE — ED Notes (Signed)
RT at bedside for breathing tx.

## 2012-06-09 NOTE — ED Notes (Signed)
RT called for neb treatment

## 2012-06-09 NOTE — ED Notes (Signed)
4w called x1 for report. RN will call back

## 2012-06-09 NOTE — Progress Notes (Signed)
CM noted Cm consult for possible home health care needs CM spoke with pt and male family member at the bedside Male family member at bedside stated home health would be needed at d/c CM reviewed in details home health [facility]) (length of stay in home, types of [facility] staff available, coverage, primary caregiver, up to 24 hrs before services may be started), and Private duty nursing (PDN-coverage, length of stay in the home types of staff available),  CM provided family with a list of [place] home health agencies and PDN   Choice offered to / List presented to:  HOME HEALTH AGENCIES SERVING [place] [employer] that are Medicare-Certified and are affiliated with [facility] [place] [facility] Home Health Agency  Telephone Number Address  [facility]   The [facility] has ownership interest in this company; however, you are under no obligation to use this agency. [phone] or  [phone] [address] [place], [place] [address] [email or web address]   Agencies that are Medicare-Certified and are not affiliated with [facility] Agency Telephone Number Address  [facility] [phone] Fax [phone] [address] [place], [place]  [address] [email or web address]  [facility] [phone] or [phone] Fax [phone] [address] [place], [place] [address] [email or web address]  [facility] [phone] Fax [phone] [address] [place], [place] [address] [email or web address]  [facility] [phone] Fax [phone] [address] [place], [place]  [address] [email or web address]  [facility] [phone] Fax [phone] [address] [place], [place]  [address] [email or web address]  [facility] of [facility] [phone] [address] [place], [place] [address] [email or web address]  [facility] [phone]  [address] [place], [place] [address] [email or web address]  [facility] [phone] or [phone] Fax number [phone] [address] [place], [place]  [address] [email or web address]  [facility] [phone] Fax [phone] [address] [place], [place]  [address]  [facility]  [phone] Fax [phone] [address] [place], [place] [address] [email or web address]

## 2012-06-10 DIAGNOSIS — R0602 Shortness of breath: Secondary | ICD-10-CM | POA: Diagnosis present

## 2012-06-10 DIAGNOSIS — D509 Iron deficiency anemia, unspecified: Secondary | ICD-10-CM | POA: Diagnosis present

## 2012-06-10 DIAGNOSIS — R5381 Other malaise: Secondary | ICD-10-CM

## 2012-06-10 DIAGNOSIS — J441 Chronic obstructive pulmonary disease with (acute) exacerbation: Secondary | ICD-10-CM | POA: Diagnosis present

## 2012-06-10 DIAGNOSIS — I4891 Unspecified atrial fibrillation: Secondary | ICD-10-CM | POA: Diagnosis present

## 2012-06-10 DIAGNOSIS — N400 Enlarged prostate without lower urinary tract symptoms: Secondary | ICD-10-CM

## 2012-06-10 DIAGNOSIS — I10 Essential (primary) hypertension: Secondary | ICD-10-CM

## 2012-06-10 DIAGNOSIS — E43 Unspecified severe protein-calorie malnutrition: Secondary | ICD-10-CM | POA: Diagnosis present

## 2012-06-10 DIAGNOSIS — I5032 Chronic diastolic (congestive) heart failure: Secondary | ICD-10-CM

## 2012-06-10 LAB — CBC
MCH: 24 pg — ABNORMAL LOW (ref 26.0–34.0)
MCHC: 31.9 g/dL (ref 30.0–36.0)
MCV: 75.1 fL — ABNORMAL LOW (ref 78.0–100.0)
Platelets: 155 10*3/uL (ref 150–400)
RDW: 21.2 % — ABNORMAL HIGH (ref 11.5–15.5)

## 2012-06-10 LAB — BASIC METABOLIC PANEL
CO2: 25 mEq/L (ref 19–32)
Calcium: 8.8 mg/dL (ref 8.4–10.5)
Creatinine, Ser: 0.76 mg/dL (ref 0.50–1.35)
GFR calc non Af Amer: 82 mL/min — ABNORMAL LOW (ref >90)
Glucose, Bld: 297 mg/dL — ABNORMAL HIGH (ref 70–99)
Sodium: 134 mEq/L — ABNORMAL LOW (ref 135–145)

## 2012-06-10 LAB — PROTEIN, TOTAL: Total Protein: 7.2 g/dL (ref 6.0–8.3)

## 2012-06-10 MED ORDER — PREDNISONE 20 MG PO TABS
30.0000 mg | ORAL_TABLET | Freq: Three times a day (TID) | ORAL | Status: DC
  Administered 2012-06-10 – 2012-06-12 (×6): 30 mg via ORAL
  Filled 2012-06-10 (×9): qty 1

## 2012-06-10 NOTE — Progress Notes (Signed)
Utilization review complete 

## 2012-06-10 NOTE — Evaluation (Signed)
Physical Therapy Evaluation Patient Details Name: [name] MRN: [id number] DOB: 09-02-1926 Today's Date: 06/10/2012 Time: 4098-1191 PT Time Calculation (min): 24 min  PT Assessment / Plan / Recommendation Clinical Impression  Patient is an 79 y/o gentleman admited with SPB and COPD exacerbation.  He presents with decreasd balance and activity tolerace limiting independence and safety with mobiliy.  He will benefit from skilled PT in the actue setting and HHPT at d/c to address generlized weakness and will bnefit from skilled PT to allow safe d/c back home to indep living apartment.    PT Assessment  Patient needs continued PT services    Follow Up Recommendations  Home health PT       Barriers to Discharge Decreased caregiver support      Equipment Recommendations  None recommended by PT    Recommendations for Other Services     Frequency Min 3X/week    Precautions / Restrictions Precautions Precautions: Fall   Pertinent Vitals/Pain Denies pain      Mobility  Bed Mobility Bed Mobility: Sit to Supine Sit to Supine: 5: Supervision;HOB flat Details for Bed Mobility Assistance: needed assist with linens on bed Transfers Transfers: Sit to Stand;Stand to Sit Sit to Stand: From bed;5: Supervision Stand to Sit: To bed;5: Supervision Details for Transfer Assistance: cues for positioning near head of bed to sit Ambulation/Gait Ambulation/Gait Assistance: 5: Supervision Ambulation Distance (Feet): 150 Feet Assistive device: Rolling walker Ambulation/Gait Assistance Details: forward flexed wtih walker out in front. Gait Pattern: Step-through pattern;Decreased stride length;Shuffle;Trunk flexed        PT Diagnosis: Difficulty walking;Abnormality of gait;Generalized weakness  PT Problem List: Decreased strength;Decreased activity tolerance;Decreased balance;Decreased mobility;Decreased safety awareness;Cardiopulmonary status limiting activity PT Treatment  Interventions: DME instruction;Gait training;Balance training;Functional mobility training;Therapeutic activities;Therapeutic exercise;Patient/family education   PT Goals Acute Rehab PT Goals PT Goal Formulation: With patient Time For Goal Achievement: 06/17/12 Potential to Achieve Goals: Good Pt will go Supine/Side to Sit: with modified independence PT Goal: Supine/Side to Sit - Progress: Goal set today Pt will go Sit to Supine/Side: with modified independence PT Goal: Sit to Supine/Side - Progress: Goal set today Pt will go Sit to Stand: with modified independence PT Goal: Sit to Stand - Progress: Goal set today Pt will Stand: with modified independence PT Goal: Stand - Progress: Goal set today Pt will Ambulate: >150 feet;1 - 15 feet PT Goal: Ambulate - Progress: Goal set today Pt will Perform Home Exercise Program: Independently PT Goal: Perform Home Exercise Program - Progress: Goal set today  Visit Information  Last PT Received On: 06/10/12 Assistance Needed: +1    Subjective Data  Subjective: I loaned my walker to my grandson. he had the gout.  Should be able to get it back from him. Patient Stated Goal: To return home   Prior Functioning  Home Living Lives With: Alone Type of Home: Apartment Home Access: Level entry;Elevator Home Layout: One level Bathroom Shower/Tub: Tub/shower unit Home Adaptive Equipment: Environmental consultant - four wheeled;Straight cane Additional Comments: pull cord in bedroom and bathroom Prior Function Level of Independence: Independent Driving: Yes Communication Communication: No difficulties    Cognition  Cognition Overall Cognitive Status: Appears within functional limits for tasks assessed/performed Arousal/Alertness: Awake/alert Orientation Level: Appears intact for tasks assessed Behavior During Session: [facility] for tasks performed    Extremity/Trunk Assessment Right Lower Extremity Assessment RLE ROM/Strength/Tone: [facility] for tasks assessed Left  Lower Extremity Assessment LLE ROM/Strength/Tone: [employer] for tasks assessed Trunk Assessment Trunk Assessment: Kyphotic   Balance  Balance Balance Assessed: Yes Static Sitting Balance Static Sitting - Balance Support: Feet supported Static Sitting - Level of Assistance: 6: Modified independent (Device/Increase time) Static Sitting - Comment/# of Minutes: sitting edge of bed initially after eating lunch  Static Standing Balance Static Standing - Balance Support: Bilateral upper extremity supported Static Standing - Level of Assistance: 5: Stand by assistance;Not tested (comment) Static Standing - Comment/# of Minutes: stood few seconds prior to walking  End of Session PT - End of Session Equipment Utilized During Treatment: Gait belt Activity Tolerance: Patient limited by fatigue Patient left: in bed;with call bell/phone within reach  GP     [facility] 06/10/2012, 3:11 PM [place], [place] [phone] 06/10/2012

## 2012-06-10 NOTE — Progress Notes (Signed)
Subjective: Mr. Lang is admitted with increased hypoxemia w/o evidence of CHF and scant evidence for infection. Has been started on antibioitcs, IV steroids and inhalational therapy. Per his daughter he does have progressive weakness with greater need for assistance with ADLs.   Mr. Goley admits to increased SOB. Several days ago he did have a cough productive of a whitist sputum. He denies any fever or rigors. No GI symptoms. No chest pain or discomfort.   Objective: Lab: Lab Results  Component Value Date   WBC 6.2 06/10/2012   HGB 10.4* 06/10/2012   HCT 32.6* 06/10/2012   MCV 75.1* 06/10/2012   PLT 155 06/10/2012   BMET    Component Value Date/Time   NA 134* 06/10/2012 0534   NA 142 04/11/2012 0950   K 3.9 06/10/2012 0534   K 3.9 04/11/2012 0950   CL 97 06/10/2012 0534   CL 105 04/11/2012 0950   CO2 25 06/10/2012 0534   CO2 29 04/11/2012 0950   GLUCOSE 297* 06/10/2012 0534   GLUCOSE 86 04/11/2012 0950   BUN 19 06/10/2012 0534   BUN 20.0 04/11/2012 0950   CREATININE 0.76 06/10/2012 0534   CREATININE 0.9 04/11/2012 0950   CALCIUM 8.8 06/10/2012 0534   CALCIUM 9.1 04/11/2012 0950   GFRNONAA 82* 06/10/2012 0534   GFRAA >90 06/10/2012 0534     Imaging: CXR 06/09/12:IMPRESSION:  Pulmonary vascular prominence / congestion superimposed upon  chronic changes .  Scheduled Meds: . albuterol  2.5 mg Nebulization Q6H  . aspirin EC  81 mg Oral Daily  . diltiazem  120 mg Oral Daily  . finasteride  5 mg Oral Daily  . furosemide  40 mg Oral BID  . gabapentin  300 mg Oral QHS  . guaiFENesin  300 mg Oral Q8H  . heparin  5,000 Units Subcutaneous Q8H  . ipratropium  0.5 mg Nebulization Q6H  . levofloxacin (LEVAQUIN) IV  750 mg Intravenous Q24H  . methylPREDNISolone (SOLU-MEDROL) injection  60 mg Intravenous Q8H  . sodium chloride  3 mL Intravenous Q12H  . sodium chloride  3 mL Intravenous Q12H  . tamsulosin  0.4 mg Oral Daily   Continuous Infusions:  PRN Meds:.sodium chloride, acetaminophen,  acetaminophen, albuterol, ondansetron (ZOFRAN) IV, ondansetron, sodium chloride   Physical Exam: Filed Vitals:   06/10/12 0524  BP: 125/44  Pulse: 78  Temp: 98.6 F (37 C)  Resp: 18    Intake/Output Summary (Last 24 hours) at 06/10/12 1130 Last data filed at 06/10/12 0526  Gross per 24 hour  Intake      0 ml  Output    875 ml  Net   -875 ml   Gen'l- very thin, emaciated AA man in no acute distress. HEENT- sunken cheeks, temporal wasting Cor- 2+ radial, RRR, no JVD Pulm - increase AP diameter, prolonged expiratory phase, no rales or wheezes Abd- protuberant Neuro - A&O x 3, mental status is sharp      Assessment/Plan: 1. Pulm - patient with exacerbation of COPD. No evidence of infection: no fever, rigors, productive sputum. Plan D/c antibiotics  Change to po steroids, 30 mg prednisone tid  2. Cardiac - low BNP but his history is suggesive of fluid overload. HR controlled. Plan  D/C cardiac monitoring  Continue BID furosomide  Monitor renal function  3. Malnutrition - appears emaciated. Plan Lab: total protein, prealbumin  RD consult  4. Generalized weakness - question whether he can continue to live alone/independentlyl Plan  PT/OT already ordered.  Illene Regulus [facility] (o) [phone]; (c) [phone] Call-grp - Patsi Sears IM  Tele: [phone]  06/10/2012, 11:22 AM

## 2012-06-11 DIAGNOSIS — I4891 Unspecified atrial fibrillation: Secondary | ICD-10-CM

## 2012-06-11 LAB — BASIC METABOLIC PANEL
BUN: 21 mg/dL (ref 6–23)
Calcium: 9 mg/dL (ref 8.4–10.5)
Creatinine, Ser: 0.92 mg/dL (ref 0.50–1.35)
GFR calc Af Amer: 88 mL/min — ABNORMAL LOW (ref >90)
GFR calc non Af Amer: 76 mL/min — ABNORMAL LOW (ref >90)
Glucose, Bld: 151 mg/dL — ABNORMAL HIGH (ref 70–99)
Potassium: 3.9 mEq/L (ref 3.5–5.1)

## 2012-06-11 MED ORDER — ENSURE COMPLETE PO LIQD
237.0000 mL | Freq: Two times a day (BID) | ORAL | Status: DC
  Administered 2012-06-11 – 2012-06-12 (×2): 237 mL via ORAL

## 2012-06-11 NOTE — Progress Notes (Signed)
At rest, pt's O2 on room air is 99-100%. While ambulating, pt did become tired and slightly SOB but quickly recovered once seated in room. O2 after ambulation was also 99-100%. Will continue to monitor pt.
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
## 2012-06-11 NOTE — Evaluation (Signed)
Occupational Therapy Evaluation Patient Details Name: [name] MRN: [id number] DOB: 1926-09-23 Today's Date: 06/11/2012 Time: 4098-1191 OT Time Calculation (min): 25 min  OT Assessment / Plan / Recommendation Clinical Impression  Patient is an 79 y/o gentleman admited with SPB and COPD exacerbation.  He presents with decreasd balance and activity tolerace limiting independence and safety with ADL activity    OT Assessment  Patient needs continued OT Services    Follow Up Recommendations  Home health OT             Frequency  Min 2X/week    Precautions / Restrictions Precautions Precautions: Fall       ADL       OT Diagnosis: Generalized weakness  OT Problem List: Decreased strength;Decreased activity tolerance OT Treatment Interventions: Self-care/ADL training;Patient/family education   OT Goals Acute Rehab OT Goals OT Goal Formulation: With patient Time For Goal Achievement: 06/18/12 Potential to Achieve Goals: Good ADL Goals Pt Will Perform Grooming: with modified independence;Standing at sink ADL Goal: Grooming - Progress: Goal set today Pt Will Perform Lower Body Dressing: with modified independence;Sit to stand from chair ADL Goal: Lower Body Dressing - Progress: Goal set today Pt Will Transfer to Toilet: with modified independence;Comfort height toilet ADL Goal: Toilet Transfer - Progress: Goal set today  Visit Information  Last OT Received On: 06/11/12    Subjective Data  Subjective: I do want to sit up in a chair- but i dont have one.   Prior Functioning     Home Living Lives With: Alone Type of Home: Apartment Home Access: Level entry;Elevator Home Layout: One level Bathroom Shower/Tub: Engineer, manufacturing systems: Standard Home Adaptive Equipment: Environmental consultant - four wheeled;Straight cane Additional Comments: pull cord in bedroom and bathroom Prior Function Level of Independence: Independent Driving: Yes Communication Communication:  No difficulties            Cognition  Cognition Overall Cognitive Status: Appears within functional limits for tasks assessed/performed Arousal/Alertness: Awake/alert Orientation Level: Appears intact for tasks assessed Behavior During Session: [facility] for tasks performed    Extremity/Trunk Assessment Right Upper Extremity Assessment RUE ROM/Strength/Tone: [facility] for tasks assessed Left Upper Extremity Assessment LUE ROM/Strength/Tone: WFL for tasks assessed     Mobility Bed Mobility Bed Mobility: Sit to Supine Sit to Supine: 5: Supervision;HOB flat Details for Bed Mobility Assistance: needed assist with linens on bed Transfers Transfers: Sit to Stand;Stand to Sit Sit to Stand: From bed;5: Supervision;From chair/3-in-1 Stand to Sit: To bed;5: Supervision;To chair/3-in-1           End of Session  Pt left in chair with call bell with in reach, as well as phone  GO     Man Effertz, [place] Kung 06/11/2012, 12:02 PM

## 2012-06-11 NOTE — Progress Notes (Signed)
Subjective: Appreciate PT and OT evaluations - for [facility] services at time of discharge.   He is sitting up eating lunch. No distress   Objective: Lab: Lab Results  Component Value Date   WBC 6.2 06/10/2012   HGB 10.4* 06/10/2012   HCT 32.6* 06/10/2012   MCV 75.1* 06/10/2012   PLT 155 06/10/2012   BMET    Component Value Date/Time   NA 138 06/11/2012 0503   NA 142 04/11/2012 0950   K 3.9 06/11/2012 0503   K 3.9 04/11/2012 0950   CL 101 06/11/2012 0503   CL 105 04/11/2012 0950   CO2 27 06/11/2012 0503   CO2 29 04/11/2012 0950   GLUCOSE 151* 06/11/2012 0503   GLUCOSE 86 04/11/2012 0950   BUN 21 06/11/2012 0503   BUN 20.0 04/11/2012 0950   CREATININE 0.92 06/11/2012 0503   CREATININE 0.9 04/11/2012 0950   CALCIUM 9.0 06/11/2012 0503   CALCIUM 9.1 04/11/2012 0950   GFRNONAA 76* 06/11/2012 0503   GFRAA 88* 06/11/2012 0503     Imaging:  Scheduled Meds: . albuterol  2.5 mg Nebulization Q6H  . aspirin EC  81 mg Oral Daily  . diltiazem  120 mg Oral Daily  . finasteride  5 mg Oral Daily  . furosemide  40 mg Oral BID  . gabapentin  300 mg Oral QHS  . guaiFENesin  300 mg Oral Q8H  . heparin  5,000 Units Subcutaneous Q8H  . ipratropium  0.5 mg Nebulization Q6H  . predniSONE  30 mg Oral TID AC  . sodium chloride  3 mL Intravenous Q12H  . sodium chloride  3 mL Intravenous Q12H  . tamsulosin  0.4 mg Oral Daily   Continuous Infusions:  PRN Meds:.sodium chloride, acetaminophen, acetaminophen, albuterol, ondansetron (ZOFRAN) IV, ondansetron, sodium chloride   Physical Exam: Filed Vitals:   06/11/12 0400  BP: 131/58  Pulse: 98  Temp: 98.2 F (36.8 C)  Resp: 18    Intake/Output Summary (Last 24 hours) at 06/11/12 1216 Last data filed at 06/11/12 0600  Gross per 24 hour  Intake    480 ml  Output   1650 ml  Net  -1170 ml   Wt Readings from Last 3 Encounters:  06/11/12 121 lb 0.5 oz (54.9 kg)  05/31/12 126 lb 3.2 oz (57.244 kg)  05/11/12 129 lb 3.2 oz (58.605 kg)   Gen'l - cachetic AA  man in no distress HEENT - temporal wasting, sunken cheeks. Cor- 2+ radial, RRR Pulm - prolonged expiratory phase, feint end-expiratory wheeze, no increased WOB Neuor - A&O x 3.       Assessment/Plan: 1. Pul - stable, decreased wheezing and decreased WOB. Plan Continue present steroid dose  RA O2 sats at rest and with exertion  2. Cardiac - stable  3. Nutrition - eating a good lunch. RD consult pending  4. Generalized weakness - PT/OT eval noted: for [facility] PT/OT/Aide   Illene Regulus [place] [facility] (o) [phone]; (c) [phone] Call-grp - Patsi Sears IM  Tele: [phone]  06/11/2012, 12:10 PM

## 2012-06-12 ENCOUNTER — Other Ambulatory Visit: Payer: Self-pay | Admitting: Internal Medicine

## 2012-06-12 DIAGNOSIS — E538 Deficiency of other specified B group vitamins: Secondary | ICD-10-CM

## 2012-06-12 DIAGNOSIS — J449 Chronic obstructive pulmonary disease, unspecified: Secondary | ICD-10-CM

## 2012-06-12 MED ORDER — PREDNISONE 10 MG PO TABS: 30.0000 mg | ORAL_TABLET | ORAL | Status: DC

## 2012-06-12 MED ORDER — ENSURE COMPLETE PO LIQD: 237.0000 mL | Freq: Two times a day (BID) | ORAL | Status: DC

## 2012-06-12 NOTE — Telephone Encounter (Signed)
Rx request to pharmacy/SLS  

## 2012-06-12 NOTE — Progress Notes (Signed)
Subjective: No complaints. Is breathing easier  Objective: Lab: Lab Results  Component Value Date   WBC 6.2 06/10/2012   HGB 10.4* 06/10/2012   HCT 32.6* 06/10/2012   MCV 75.1* 06/10/2012   PLT 155 06/10/2012   BMET    Component Value Date/Time   NA 138 06/11/2012 0503   NA 142 04/11/2012 0950   K 3.9 06/11/2012 0503   K 3.9 04/11/2012 0950   CL 101 06/11/2012 0503   CL 105 04/11/2012 0950   CO2 27 06/11/2012 0503   CO2 29 04/11/2012 0950   GLUCOSE 151* 06/11/2012 0503   GLUCOSE 86 04/11/2012 0950   BUN 21 06/11/2012 0503   BUN 20.0 04/11/2012 0950   CREATININE 0.92 06/11/2012 0503   CREATININE 0.9 04/11/2012 0950   CALCIUM 9.0 06/11/2012 0503   CALCIUM 9.1 04/11/2012 0950   GFRNONAA 76* 06/11/2012 0503   GFRAA 88* 06/11/2012 0503     Imaging:  Scheduled Meds: . albuterol  2.5 mg Nebulization Q6H  . aspirin EC  81 mg Oral Daily  . diltiazem  120 mg Oral Daily  . feeding supplement  237 mL Oral BID BM  . finasteride  5 mg Oral Daily  . furosemide  40 mg Oral BID  . gabapentin  300 mg Oral QHS  . guaiFENesin  300 mg Oral Q8H  . heparin  5,000 Units Subcutaneous Q8H  . ipratropium  0.5 mg Nebulization Q6H  . predniSONE  30 mg Oral TID AC  . sodium chloride  3 mL Intravenous Q12H  . sodium chloride  3 mL Intravenous Q12H  . tamsulosin  0.4 mg Oral Daily   Continuous Infusions:  PRN Meds:.sodium chloride, acetaminophen, acetaminophen, albuterol, ondansetron (ZOFRAN) IV, ondansetron, sodium chloride   Physical Exam: Filed Vitals:   06/12/12 0709  BP: 112/51  Pulse: 91  Temp: 97.9 F (36.6 C)  Resp: 18    See d/c summary    Assessment/Plan: For d/c home with home health  Dictated [phone]   Illene Regulus [place] [facility] ([phone]; (c) [phone] Call-grp - Patsi Sears IM  Tele: [phone]  06/12/2012, 8:02 AM

## 2012-06-12 NOTE — Discharge Summary (Signed)
[name], [name] NO.:  [id number]  MEDICAL RECORD NO.:  [id number]  LOCATION:  1422                         FACILITY:  [facility]  PHYSICIAN:  [name]. Norins, MD  DATE OF BIRTH:  [birth year]  DATE OF ADMISSION:  06/09/2012 DATE OF DISCHARGE:  06/12/2012                              DISCHARGE SUMMARY   ADMITTING DIAGNOSES: 1. Exacerbation of chronic obstructive pulmonary disease with     shortness of breath and hypoxemia. 2. Weakness and deconditioning. 3. Benign prostatic hypertrophy. 4. History of atrial fibrillation, rate controlled. 5. Anemia.  DISCHARGE DIAGNOSES: 1. Exacerbation of chronic obstructive pulmonary disease with     shortness of breath and hypoxemia. 2. Weakness and deconditioning. 3. Benign prostatic hypertrophy. 4. History of atrial fibrillation, rate controlled. 5. Anemia.  CONSULTANTS:  None.  PROCEDURES:  Chest x-ray on the day of admission, 06/09/2012, which showed pulmonary vascular prominence/congestion superimposed upon chronic changes.  HISTORY OF PRESENT ILLNESS:  [name] is an 79 year old, African- American gentleman who lives at home alone.  He presented complaining of increasing shortness of breath over the several days prior to admission. His problem was exacerbated with exertion.  His daughter who is at his bedside at admission described weakness and reports that his appetite was poor.  He was unable to stand long enough to prepare meals.  He does use a cane when he ambulates.  Daughter states that he is also unable to manage his ADLs including bathing.  The patient does not describe orthopnea as no increased lower extremity edema, no fevers or nausea or vomiting.  In the emergency room, oxygen saturations were 98-100%, but with exertion they would drop into the low 90s.  He also was found to have marked wheezing.  X-ray showed chronic changes, but no pneumonia or signs of heart failure and his BNP was less than  200.  The patient did become increasingly short of breath with use of a urinal and therefore was admitted for acute treatment.  Please see the H and P for complete past medical history, family history, social history, as well as physical exam.  HOSPITAL COURSE: 1. Pulmonary.  Patient with acute exacerbation of underlying     emphysema.  He was also having significant airway inflammation with     wheezing.  He initially was started on Levaquin, but with no fever,     white count, no productive sputum, antibiotics were discontinued.     The patient was put to a steroid burst and taper, starting at 30 mg     t.i.d. and he will continue a taper discharge.  On room air, oxygen     after exertion were checked and he was in the 95+ range on pulse     oximetry and does not need or qualify for home oxygen. 2. Cardiac.  The patient has been stable. 3. Nutrition.  The patient was seen by Ebbie Latus, RD, on June 11, 2012, and was felt to have severe malnutrition in context of     chronic disease.  Recommendation was for him to have a regular diet     and to have Ensure Complete twice daily.  With the patient's respiratory status being stable, at this time,     he is ready for discharge to home.  DISCHARGE PHYSICAL EXAMINATION:  VITAL SIGNS:  Temperature was 97.9, blood pressure 112/51, heart rate 91, respirations 18, O2 sats 98% on room air. GENERAL APPEARANCE:  This is a cachectic African-American gentleman looking older than his stated age in no acute distress. HEENT Exam:  Temporal wasting is noted.  Sunken cheeks with prominent zygoma are noted.  Conjunctiva and sclerae were clear. NECK:  Supple. CHEST:  Patient has increased AP diameter. LUNGS:  Patient is moving air, but there is decreased breath sounds.  He has end-expiratory faint wheezing.  There is no increased work of breathing. CARDIOVASCULAR:  2+ radial pulses.  Precordium is quiet.  Heart sounds were distant, but  regular on my exam. ABDOMEN:  Scaphoid with positive bowel sounds.  No guarding or rebound or tenderness was appreciated. GENITALIA/RECTAL:  Deferred. EXTREMITIES:  Without deformity. NEUROLOGIC:  Patient is awake, alert.  He is oriented to person, to place, time, and context.  FINAL LABORATORY:  Chemistries from June 11, 2012, with a sodium 138, potassium 3.9, chloride 101, CO2 of 27, BUN 21, creatinine 0.9, glucose was 151, total protein from June 10, 2012, was 7.2.  Pre-albumin was low at 13.3.  Final CBC from June 10, 2012, with a white count of 6200, hemoglobin was 10.4 g, platelet count 155,000.  The patient did have elevated glucose at 297, but his baseline was 89 and then 151, he is not diabetic.  DISCHARGE MEDICATIONS:  Albuterol metered dose inhaler, 2 puffs 2 times daily as needed, Fosamax 70 mg daily, aspirin 325 mg daily, calcium with vitamin D 1 tablet daily, cholecalciferol 1000 international units 1 cap daily, vitamin B12 1000 mcg/mL injections monthly, Cardizem CD 120 mg 1 capsule daily, finasteride 5 mg daily, Advair 115/21 two puffs into the lungs b.i.d., furosemide 40 mg b.i.d., gabapentin 300 mg at bedtime, guaifenesin 150 mg per 15 mL, 30 mL every 8 hours, DuoNeb 0.5/2.5, one treatment 4 times daily, prednisone 10 mg tabs taking 3 tabs b.i.d. for 3 days, 2 tabs b.i.d. for 3 days, 1 tab daily for 6 days,  tamsulosin 0.4 mg daily.  DISPOSITION:  The patient is discharged to home.  He will be seen in the office in 1 week for followup sooner as needed.  The patient's condition at time of discharge dictation is stable, but guarded.     [name] Norins, MD     MEN/MEDQ  D:  06/12/2012  T:  06/12/2012  Job:  [id number]

## 2012-06-12 NOTE — Progress Notes (Signed)
Talked to patient about DCP/ home health care choices; patient requested that I call his grandson Cailen Mihalik to chose a [facility] agency; TCT Hessie Diener - he chose [facility]; Olene Craven with [facility] called for arrangements. Abelino Derrick RN,BSN,MHA

## 2012-06-13 ENCOUNTER — Telehealth: Payer: Self-pay | Admitting: Family Medicine

## 2012-06-13 ENCOUNTER — Inpatient Hospital Stay: Payer: PRIVATE HEALTH INSURANCE | Admitting: Internal Medicine

## 2012-06-13 NOTE — Telephone Encounter (Signed)
Transitional Care Call:  Spoke with pt.  Discharged from hospital 06/12/2012.  D/C [id number] of chronic obstructive pulmonary disease with  shortness of breath and hypoxemia, Weakness and deconditioning, Benign prostatic hypertrophy, History of atrial fibrillation, rate controlled.and Anemia.  Pt states he is "feeling well".  Denies SOB.  Pt lives at home alone but does have help from daughter-in-law and grandson.  Pt requests that Dr. Debby Bud send in RX for cough medication.  He states that he had cough medication during his hospital stay and that it "really helped".  Hospital f/u appt scheduled on 3/31 @1115am .

## 2012-06-13 NOTE — Telephone Encounter (Signed)
Attempted transitional care call, no answer.  Will attempt to call again. 

## 2012-06-14 NOTE — Telephone Encounter (Signed)
Thank you. Hospital notes reviewed. He was getting guafenesin 150 mg/15 ml for cough. He can use otc cough syrup, e.g. Robitussin DM - equivalent - 1 tsp q 4 prn.

## 2012-06-16 ENCOUNTER — Telehealth: Payer: Self-pay

## 2012-06-16 NOTE — Telephone Encounter (Signed)
[name] (grand son) called wondering why [name] was taken off his Albuterol when he was discharge from the hospital? Please advise

## 2012-06-16 NOTE — Telephone Encounter (Signed)
HHRN called to report pt's BP - 92/58. Pt is asymptomatic.

## 2012-06-16 NOTE — Telephone Encounter (Signed)
Per d/c summary and med list he was not taken off albuterol MDI. He is too use advair and maintenance, duonebs on a regular basis and Abluterol MDI as a rescue med.   Can discuss at 3/31 ov - hosp f/u

## 2012-06-19 ENCOUNTER — Ambulatory Visit (INDEPENDENT_AMBULATORY_CARE_PROVIDER_SITE_OTHER): Payer: PRIVATE HEALTH INSURANCE | Admitting: Internal Medicine

## 2012-06-19 ENCOUNTER — Encounter: Payer: Self-pay | Admitting: Internal Medicine

## 2012-06-19 VITALS — BP 132/70 | HR 88 | Temp 97.6°F | Resp 25 | Ht 66.0 in | Wt 128.4 lb

## 2012-06-19 DIAGNOSIS — J449 Chronic obstructive pulmonary disease, unspecified: Secondary | ICD-10-CM
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
## 2012-06-19 NOTE — Assessment & Plan Note (Signed)
Recent hospitalization for viral exacerbation of his COPD. He is doing well with good respiratory function , normal oxygen saturation and adherence to his medical regimen.  Plan Complete steroids  Continue present regimen: duonebs qid, advair AM/HS, albuterol prn.

## 2012-06-20 NOTE — Telephone Encounter (Signed)
Pt seen yesterday OV, discussed at that time.

## 2012-06-20 NOTE — Telephone Encounter (Signed)
Pt seen yesterday.

## 2012-06-22 ENCOUNTER — Telehealth: Payer: Self-pay | Admitting: *Deleted

## 2012-06-22 ENCOUNTER — Other Ambulatory Visit: Payer: PRIVATE HEALTH INSURANCE | Admitting: Lab

## 2012-06-22 ENCOUNTER — Ambulatory Visit: Payer: PRIVATE HEALTH INSURANCE

## 2012-06-22 NOTE — Telephone Encounter (Signed)
Left msg on triage was out seeing pt today. He c/o SOB, more loose cough. He has increase edema in (L) extremities, and his weight is up 5lbs since last week. Requesting md advisement.Marland KitchenRaechel Chute

## 2012-06-23 DIAGNOSIS — J441 Chronic obstructive pulmonary disease with (acute) exacerbation: Secondary | ICD-10-CM

## 2012-06-23 DIAGNOSIS — J438 Other emphysema: Secondary | ICD-10-CM

## 2012-06-23 DIAGNOSIS — I5032 Chronic diastolic (congestive) heart failure: Secondary | ICD-10-CM

## 2012-06-23 DIAGNOSIS — I509 Heart failure, unspecified: Secondary | ICD-10-CM

## 2012-06-23 NOTE — Telephone Encounter (Signed)
Double the lasix to 80 mg twice a day for 3 days, then resume 40 mg twice a day.  Daily weights.  Thanks

## 2012-06-26 NOTE — Telephone Encounter (Signed)
Phone call to pt explaining to take 80 mg twice daily only for 3 days then resume 40 mg twice daily. I explained this more than once. Pt had an understanding and advised if any questions to give the office a call.

## 2012-06-27 ENCOUNTER — Telehealth: Payer: Self-pay

## 2012-06-27 NOTE — Telephone Encounter (Signed)
HHRN called to report hypotension with this pt - 88/42 asymptomatic. HHRn is requesting MD advise if pt should HOLD Diltiazem and lasix, please advise.

## 2012-06-27 NOTE — Telephone Encounter (Signed)
1. Hold diltiazem today. 2. Hold furosdemide x two days then resume at 40 mg once a day. 3. Remind him to follow the "rule of 2's" - to weigh every day and if his weight goes up by 2+ lbs in 2 days he needs to double the lasix ( to 40 mg bid) for 2 days.

## 2012-06-27 NOTE — Telephone Encounter (Signed)
Notified Beth with md response.../lmb 

## 2012-06-29 ENCOUNTER — Other Ambulatory Visit: Payer: Self-pay | Admitting: Oncology

## 2012-07-04 ENCOUNTER — Telehealth: Payer: Self-pay

## 2012-07-04 NOTE — Telephone Encounter (Signed)
Noted. Thx.

## 2012-07-04 NOTE — Telephone Encounter (Signed)
HHRN called to inform MD that pt will be discharged because he does not meet the home health standards required by Medicare. Pt is not home bound.

## 2012-07-06 ENCOUNTER — Ambulatory Visit (INDEPENDENT_AMBULATORY_CARE_PROVIDER_SITE_OTHER)
Admission: RE | Admit: 2012-07-06 | Discharge: 2012-07-06 | Disposition: A | Discharge status: Home / Self Care | Payer: PRIVATE HEALTH INSURANCE | Source: Ambulatory Visit | Attending: Internal Medicine | Admitting: Internal Medicine

## 2012-07-06 ENCOUNTER — Other Ambulatory Visit (HOSPITAL_BASED_OUTPATIENT_CLINIC_OR_DEPARTMENT_OTHER): Payer: PRIVATE HEALTH INSURANCE | Admitting: Lab

## 2012-07-06 ENCOUNTER — Ambulatory Visit (HOSPITAL_BASED_OUTPATIENT_CLINIC_OR_DEPARTMENT_OTHER): Payer: PRIVATE HEALTH INSURANCE

## 2012-07-06 ENCOUNTER — Encounter: Payer: Self-pay | Admitting: Internal Medicine

## 2012-07-06 ENCOUNTER — Ambulatory Visit (INDEPENDENT_AMBULATORY_CARE_PROVIDER_SITE_OTHER): Payer: PRIVATE HEALTH INSURANCE | Admitting: Internal Medicine

## 2012-07-06 VITALS — BP 92/56 | HR 113 | Temp 99.2°F

## 2012-07-06 VITALS — BP 84/53 | HR 105 | Temp 98.2°F | Resp 24

## 2012-07-06 DIAGNOSIS — N189 Chronic kidney disease, unspecified: Secondary | ICD-10-CM

## 2012-07-06 DIAGNOSIS — C61 Malignant neoplasm of prostate: Secondary | ICD-10-CM

## 2012-07-06 DIAGNOSIS — D649 Anemia, unspecified: Secondary | ICD-10-CM

## 2012-07-06 DIAGNOSIS — J441 Chronic obstructive pulmonary disease with (acute) exacerbation: Secondary | ICD-10-CM

## 2012-07-06 DIAGNOSIS — R509 Fever, unspecified: Secondary | ICD-10-CM

## 2012-07-06 DIAGNOSIS — R0602 Shortness of breath: Secondary | ICD-10-CM

## 2012-07-06 DIAGNOSIS — D638 Anemia in other chronic diseases classified elsewhere: Secondary | ICD-10-CM

## 2012-07-06 LAB — CBC WITH DIFFERENTIAL/PLATELET
Eosinophils Absolute: 0.1 10*3/uL (ref 0.0–0.5)
LYMPH%: 5.3 % — ABNORMAL LOW (ref 14.0–49.0)
MONO#: 0.8 10*3/uL (ref 0.1–0.9)
NEUT#: 10.9 10*3/uL — ABNORMAL HIGH (ref 1.5–6.5)
Platelets: 153 10*3/uL (ref 140–400)
RBC: 4.41 10*6/uL (ref 4.20–5.82)
RDW: 24.9 % — ABNORMAL HIGH (ref 11.0–14.6)
WBC: 12.5 10*3/uL — ABNORMAL HIGH (ref 4.0–10.3)

## 2012-07-06 MED ORDER — DARBEPOETIN ALFA-POLYSORBATE 300 MCG/0.6ML IJ SOLN
300.0000 ug | Freq: Once | INTRAMUSCULAR | Status: AC
  Administered 2012-07-06: 300 ug via SUBCUTANEOUS
  Filled 2012-07-06: qty 0.6

## 2012-07-06 MED ORDER — LEVOFLOXACIN 250 MG PO TABS: 250.0000 mg | ORAL_TABLET | Freq: Every day | ORAL | Status: DC

## 2012-07-06 NOTE — Patient Instructions (Signed)

## 2012-07-06 NOTE — Progress Notes (Signed)
HPI  Pt presents to the clinic today with c/o fever and cough. This started yesterday. He does have COPD but he does not feel too bad. He does feel fatigued. He does feel short of breath but this is relieved when he uses his inhalers. He tends to be more short of breath during and right after walking. He did go to the cancer center today for an infusion. His white count there was elevated at 12. He does have sick contacts.  Review of Systems      Past Medical History  Diagnosis Date  . Atrial fibrillation   . Heart failure, diastolic, chronic     EF 55-60% echo 2012  . Diagnosis unknown     lytic lesions-spine-biopsy negative?  . Prostate cancer   . Tricuspid regurgitation     pulmonary hypertension and mitral regurgitation  . Anemia, iron deficiency   . Measles   . Chronic obstructive pulmonary emphysema   . Fibrous dysplasia of bone     Bone scan Sept '11 - no mets; skeletal survey Sept '11  . CHF (congestive heart failure)     Family History  Problem Relation Age of Onset  . Heart attack Father   . Heart disease Father   . Cancer Daughter     breast  . Diabetes Daughter   . Heart disease Mother   . Heart disease Brother     heart failure  . COPD Brother     History   Social History  . Marital Status: Single    Spouse Name: N/A    Number of Children: 2  . Years of Education: 11   Occupational History  . maintenance     retired   Social History Main Topics  . Smoking status: Former Smoker -- 0.50 packs/day for 60 years    Types: Cigarettes    Quit date: 07/08/2003  . Smokeless tobacco: Never Used  . Alcohol Use: Yes     Comment: heavy drinker, quit '07  . Drug Use: No  . Sexually Active: Not Currently   Other Topics Concern  . Not on file   Social History Narrative   Finished 11th grade. Married  '55 - 10 yrs/divorced; married '84 - 8 yrs/divorced. 2 dtrs -'62, '52 4 grandchildren, 6 great-grands. 1 great-great grand. Work - Corporate treasurer.  Lives alone - [place]. End of life care: full code including intubation.     No Known Allergies   Constitutional: Positive fatigue and fever. Denies headache or abrupt weight changes.  HEENT:  Positive sore throat. Denies eye redness, eye pain, pressure behind the eyes, facial pain, nasal congestion, ear pain, ringing in the ears, wax buildup, runny nose or bloody nose. Respiratory: Positive cough. Denies difficulty breathing or shortness of breath.  Cardiovascular: Denies chest pain, chest tightness, palpitations or swelling in the hands or feet.   No other specific complaints in a complete review of systems (except as listed in HPI above).  Objective:   BP 92/56  Pulse 113  Temp(Src) 99.2 F (37.3 C) (Oral)  SpO2 93% Wt Readings from Last 3 Encounters:  06/19/12 128 lb 6.4 oz (58.242 kg)  06/12/12 120 lb 9.5 oz (54.7 kg)  05/31/12 126 lb 3.2 oz (57.244 kg)     General: Appears his stated age, well developed, well nourished in NAD. HEENT: Head: normal shape and size; Eyes: sclera white, no icterus, conjunctiva pink, PERRLA and EOMs intact; Ears: Tm's gray and intact, normal light reflex;  Nose: mucosa pink and moist, septum midline; Throat/Mouth: + PND. Teeth present, mucosa erythematous and moist, no exudate noted, no lesions or ulcerations noted.  Neck: Mild cervical lymphadenopathy. Neck supple, trachea midline. No massses, lumps or thyromegaly present.  Cardiovascular: Normal rate and rhythm. S1,S2 noted.  No murmur, rubs or gallops noted. No JVD or BLE edema. No carotid bruits noted. Pulmonary/Chest: Normal effort and coarse ronchi throughout. No respiratory distress. No wheezes, rales noted.      Assessment & Plan:   COPD exacerbation with possible pneumonia, new onset with additional workup required:  Get some rest and drink plenty of water Do salt water gargles for the sore throat eRx for Levaquin x 10 days Chest xray to r/o pneumonia  RTC as needed or  if symptoms persist.

## 2012-07-11 DIAGNOSIS — J449 Chronic obstructive pulmonary disease, unspecified: Secondary | ICD-10-CM

## 2012-07-14 ENCOUNTER — Telehealth: Payer: Self-pay

## 2012-07-14 NOTE — Telephone Encounter (Signed)
Mrs. Gerre Pebbles called back and stated that [facility] is the home health agency.

## 2012-07-14 NOTE — Telephone Encounter (Signed)
Phone call to pt's daughter [place] (not [place]) asking her who the home health agency was that was coming in to help her dad. She will call back with that information. She states he really needs help due to his weakness, needing help bathing, etc. [place] has been helping but really can not continue due to having a part time job and being disabled. Will wait for her call back.

## 2012-07-14 NOTE — Telephone Encounter (Signed)
Phone call from pt's daughter Joelene Millin. She is requesting pt have a regular home health aid to assist him in the home with bathing, cleaning up etc. Since he now on a walker regularly since he is not stable using his cane. Please advise.

## 2012-07-14 NOTE — Telephone Encounter (Signed)
Phone call to [place] and left a detailed msg giving her the phone # to [facility] to request additional services and they can fax or call for verbal orders

## 2012-07-14 NOTE — Telephone Encounter (Signed)
My understanding is that home health aides are only provided if also getting Home Health services due to a medical condition such as post hospn for CHF or similar serious health issue.  If this is true, please call the home health agency to ask for this additional service (or ask the nurse at the home visit).  If not true, this service can be provided per private pay, and we can provide the family or pt with the numbers for local agencies

## 2012-07-17 ENCOUNTER — Telehealth: Payer: Self-pay

## 2012-07-17 NOTE — Telephone Encounter (Signed)
Ok for verbal order for [facility]

## 2012-07-17 NOTE — Telephone Encounter (Signed)
Freida Busman called [facility] request referral for home health to assist with ADL (baths etc) Thanks

## 2012-07-18 ENCOUNTER — Other Ambulatory Visit: Payer: Self-pay | Admitting: Internal Medicine

## 2012-07-18 DIAGNOSIS — Z742 Need for assistance at home and no other household member able to render care: Secondary | ICD-10-CM

## 2012-07-18 NOTE — Telephone Encounter (Signed)
Need referral placed for [facility] for ADL's.

## 2012-07-18 NOTE — Telephone Encounter (Signed)
[name], Referral placed

## 2012-07-18 NOTE — Telephone Encounter (Signed)
Pt's grandson informed to contact Medicaid for referral for [facility] services.

## 2012-07-20 ENCOUNTER — Ambulatory Visit: Payer: PRIVATE HEALTH INSURANCE

## 2012-07-20 ENCOUNTER — Other Ambulatory Visit: Payer: PRIVATE HEALTH INSURANCE | Admitting: Lab

## 2012-07-31 ENCOUNTER — Other Ambulatory Visit: Payer: Self-pay | Admitting: Internal Medicine

## 2012-08-03 ENCOUNTER — Other Ambulatory Visit (HOSPITAL_BASED_OUTPATIENT_CLINIC_OR_DEPARTMENT_OTHER): Payer: PRIVATE HEALTH INSURANCE | Admitting: Lab

## 2012-08-03 ENCOUNTER — Ambulatory Visit (HOSPITAL_BASED_OUTPATIENT_CLINIC_OR_DEPARTMENT_OTHER): Payer: PRIVATE HEALTH INSURANCE

## 2012-08-03 VITALS — BP 107/43 | HR 83 | Temp 97.8°F

## 2012-08-03 DIAGNOSIS — D638 Anemia in other chronic diseases classified elsewhere: Secondary | ICD-10-CM

## 2012-08-03 DIAGNOSIS — N189 Chronic kidney disease, unspecified: Secondary | ICD-10-CM

## 2012-08-03 DIAGNOSIS — D649 Anemia, unspecified: Secondary | ICD-10-CM

## 2012-08-03 DIAGNOSIS — C61 Malignant neoplasm of prostate: Secondary | ICD-10-CM

## 2012-08-03 LAB — CBC WITH DIFFERENTIAL/PLATELET
Basophils Absolute: 0.1 10*3/uL (ref 0.0–0.1)
Eosinophils Absolute: 0.2 10*3/uL (ref 0.0–0.5)
HCT: 31.9 % — ABNORMAL LOW (ref 38.4–49.9)
HGB: 10.3 g/dL — ABNORMAL LOW (ref 13.0–17.1)
LYMPH%: 11.9 % — ABNORMAL LOW (ref 14.0–49.0)
MONO#: 0.5 10*3/uL (ref 0.1–0.9)
NEUT#: 5.5 10*3/uL (ref 1.5–6.5)
NEUT%: 77.6 % — ABNORMAL HIGH (ref 39.0–75.0)
Platelets: 125 10*3/uL — ABNORMAL LOW (ref 140–400)
WBC: 7.1 10*3/uL (ref 4.0–10.3)

## 2012-08-03 LAB — IRON AND TIBC
%SAT: 14 % — ABNORMAL LOW (ref 20–55)
TIBC: 180 ug/dL — ABNORMAL LOW (ref 215–435)

## 2012-08-03 MED ORDER — DARBEPOETIN ALFA-POLYSORBATE 300 MCG/0.6ML IJ SOLN
300.0000 ug | Freq: Once | INTRAMUSCULAR | Status: AC
  Administered 2012-08-03: 300 ug via SUBCUTANEOUS
  Filled 2012-08-03: qty 0.6

## 2012-08-18 ENCOUNTER — Encounter: Payer: Self-pay | Admitting: Internal Medicine

## 2012-08-18 ENCOUNTER — Other Ambulatory Visit: Payer: Self-pay | Admitting: Internal Medicine

## 2012-08-18 ENCOUNTER — Ambulatory Visit (INDEPENDENT_AMBULATORY_CARE_PROVIDER_SITE_OTHER): Payer: PRIVATE HEALTH INSURANCE | Admitting: Internal Medicine

## 2012-08-18 VITALS — BP 110/48 | HR 99 | Ht 66.0 in | Wt 127.0 lb

## 2012-08-18 DIAGNOSIS — I4891 Unspecified atrial fibrillation: Secondary | ICD-10-CM

## 2012-08-18 DIAGNOSIS — I5032 Chronic diastolic (congestive) heart failure: Secondary | ICD-10-CM

## 2012-08-18 MED ORDER — FUROSEMIDE 40 MG PO TABS: ORAL_TABLET | ORAL | Status: DC

## 2012-08-18 NOTE — Assessment & Plan Note (Signed)
Persistent atrial fibrillation.

## 2012-08-18 NOTE — Patient Instructions (Addendum)
Your physician recommends that you schedule a follow-up appointment in: 3 weeks with PA and 6 months with Dr Graciela Husbands   Your physician has recommended you make the following change in your medication:  1) Increase Furosemide to 80mg  in the am and 40mg  in the pm
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
## 2012-08-24 ENCOUNTER — Other Ambulatory Visit: Payer: PRIVATE HEALTH INSURANCE | Admitting: Lab

## 2012-08-24 ENCOUNTER — Ambulatory Visit: Payer: PRIVATE HEALTH INSURANCE | Admitting: Oncology

## 2012-08-24 NOTE — Assessment & Plan Note (Signed)
He is volume overloaded  Will increase his diuretic and anticipate followup with PA in next few weeks  Renal function normal 3//14 and CBC in May 2014

## 2012-08-31 ENCOUNTER — Ambulatory Visit (HOSPITAL_BASED_OUTPATIENT_CLINIC_OR_DEPARTMENT_OTHER): Payer: PRIVATE HEALTH INSURANCE

## 2012-08-31 ENCOUNTER — Encounter: Payer: Self-pay | Admitting: Oncology

## 2012-08-31 ENCOUNTER — Other Ambulatory Visit (HOSPITAL_BASED_OUTPATIENT_CLINIC_OR_DEPARTMENT_OTHER): Payer: PRIVATE HEALTH INSURANCE | Admitting: Lab

## 2012-08-31 ENCOUNTER — Telehealth: Payer: Self-pay | Admitting: Oncology

## 2012-08-31 ENCOUNTER — Ambulatory Visit (HOSPITAL_BASED_OUTPATIENT_CLINIC_OR_DEPARTMENT_OTHER): Payer: PRIVATE HEALTH INSURANCE | Admitting: Oncology

## 2012-08-31 VITALS — BP 125/49 | HR 86 | Temp 97.0°F | Resp 18 | Ht 66.0 in | Wt 123.5 lb

## 2012-08-31 DIAGNOSIS — D649 Anemia, unspecified: Secondary | ICD-10-CM

## 2012-08-31 DIAGNOSIS — D638 Anemia in other chronic diseases classified elsewhere: Secondary | ICD-10-CM

## 2012-08-31 DIAGNOSIS — N189 Chronic kidney disease, unspecified: Secondary | ICD-10-CM

## 2012-08-31 DIAGNOSIS — J449 Chronic obstructive pulmonary disease, unspecified: Secondary | ICD-10-CM

## 2012-08-31 DIAGNOSIS — C61 Malignant neoplasm of prostate: Secondary | ICD-10-CM

## 2012-08-31 DIAGNOSIS — N289 Disorder of kidney and ureter, unspecified: Secondary | ICD-10-CM

## 2012-08-31 LAB — CBC WITH DIFFERENTIAL/PLATELET
Basophils Absolute: 0.1 10*3/uL (ref 0.0–0.1)
EOS%: 1.7 % (ref 0.0–7.0)
Eosinophils Absolute: 0.1 10*3/uL (ref 0.0–0.5)
HGB: 10.7 g/dL — ABNORMAL LOW (ref 13.0–17.1)
NEUT#: 5.9 10*3/uL (ref 1.5–6.5)
RBC: 4.34 10*6/uL (ref 4.20–5.82)
RDW: 21.4 % — ABNORMAL HIGH (ref 11.0–14.6)
lymph#: 1.2 10*3/uL (ref 0.9–3.3)

## 2012-08-31 MED ORDER — DARBEPOETIN ALFA-POLYSORBATE 300 MCG/0.6ML IJ SOLN
300.0000 ug | Freq: Once | INTRAMUSCULAR | Status: AC
  Administered 2012-08-31: 300 ug via SUBCUTANEOUS
  Filled 2012-08-31: qty 0.6

## 2012-08-31 NOTE — Telephone Encounter (Signed)
Gave pt appt for labs, injection until pt see MD on October 2014

## 2012-08-31 NOTE — Progress Notes (Signed)
Hematology and Oncology Follow Up Visit  [name] [id number] September 09, 1926 79 y.o. 08/31/2012 1:48 PM Illene [name], MDNorins, [name], MD   Principle Diagnosis: 79 year old with: 1. Multifactorial anemia, likely due to chronic disease. Diagnosed in 2011. He was under the care of Dr. Abbe Amsterdam in [place].  2. IgA MGUS 3. History of prostate cancer.    Prior Therapy: S/P bone marrow biopsy in 10/2009. Id did not show MDS or plasma cell disorder. Received Feraheme 510 mg IV on 05/18/12.  Current therapy: Aransep 300 mcg every  Four weeks to keep hemoglobin above 11.   Interim History:  [name] is a an 79 year old man, I saw in 01/2012 to establish care in [place]. He is a nice man with above issues. Received Feraheme in February 2014 without infusion reaction. Since his last visit he has been doing well. Lasix was recently increased due to fluid retention. Denies fevers or difficulty breathing. No other complications noted related to Aranesp or anemia.    Medications: I have reviewed the patient's current medications. Current outpatient prescriptions:ADVAIR HFA 115-21 MCG/ACT inhaler, INHALE 2 PUFFS INTO THE LUNGS 2 (TWO) TIMES DAILY., Disp: 12 g, Rfl: 11;  albuterol (PROVENTIL HFA;VENTOLIN HFA) 108 (90 BASE) MCG/ACT inhaler, [name], Dave, Disp: 8.5 each, Rfl: 2;  alendronate (FOSAMAX) 70 MG tablet, Take 1 tablet (70 mg total) by mouth every 7 (seven) days. Take with a full glass of water on an empty stomach.  Taken on Mondays., Disp: 4 tablet, Rfl: 6 aspirin EC 81 MG tablet, Take 81 mg by mouth daily., Disp: , Rfl: ;  Calcium Carbonate-Vitamin D (CALCIUM 600+D) 600-400 MG-UNIT per tablet, Take 1 tablet by mouth daily.  , Disp: , Rfl: ;  Cholecalciferol (VITAMIN D3) 1000 UNITS CAPS, Take 1 capsule by mouth daily.  , Disp: , Rfl: ;  cyanocobalamin (,VITAMIN B-12,) 1000 MCG/ML injection, Inject 1,000 mcg into the muscle every 30 (thirty) days. , Disp: , Rfl:  diltiazem (CARDIZEM CD)  120 MG 24 hr capsule, TAKE ONE CAPSULE BY MOUTH EVERY DAY, Disp: 30 capsule, Rfl: 11;  feeding supplement (ENSURE COMPLETE) LIQD, Take 237 mLs by mouth 2 (two) times daily between meals., Disp: 60 Bottle, Rfl: 11;  finasteride (PROSCAR) 5 MG tablet, TAKE 1 TABLET BY MOUTH DAILY, Disp: 30 tablet, Rfl: 1;  furosemide (LASIX) 40 MG tablet, Take 2 tablets in the am and 1 tablet in the pm, Disp: 90 tablet, Rfl: 6 gabapentin (NEURONTIN) 300 MG capsule, TAKE AT BEDTIME. WE CAN INCREASE THE DOSE AS NEEDED., Disp: 30 capsule, Rfl: 11;  GuaiFENesin 150 MG/15ML LIQD, Take 30 mLs by mouth every 8 (eight) hours., Disp: 500 mL, Rfl: 3;  ipratropium-albuterol (DUONEB) 0.5-2.5 (3) MG/3ML SOLN, Take 3 mLs by nebulization 4 (four) times daily., Disp: 360 mL, Rfl: 11;  tamsulosin (FLOMAX) 0.4 MG CAPS, TAKE 1 CAPSULE BY MOUTH DAILY, Disp: 30 capsule, Rfl: 6  Allergies: No Known Allergies  Past Medical History, Surgical history, Social history, and Family History were reviewed and updated.  Review of Systems: Constitutional:  Negative for fever, chills, night sweats, anorexia, weight loss, pain. Cardiovascular: no chest pain or dyspnea on exertion Respiratory: negative Neurological: negative Dermatological: negative ENT: negative Skin: Negative. Gastrointestinal: negative Genito-Urinary: negative Hematological and Lymphatic: negative Breast: negative Musculoskeletal: negative Remaining ROS negative.  Physical Exam: Blood pressure 125/49, pulse 86, temperature 97 F (36.1 C), temperature source Oral, resp. rate 18, height 5\' 6"  (1.676 m), weight 123 lb 8 oz (56.019 kg). ECOG:  1 General appearance: alert Head: Normocephalic, without obvious abnormality, atraumatic Neck: no adenopathy, no carotid bruit, no JVD, supple, symmetrical, trachea midline and thyroid not enlarged, symmetric, no tenderness/mass/nodules Lymph nodes: Cervical, supraclavicular, and axillary nodes normal. Heart:regular rate and rhythm, S1,  S2 normal, no murmur, click, rub or gallop Lung:chest clear, no wheezing, rales, normal symmetric air entry Abdomen: soft, non-tender, without masses or organomegaly EXT:no erythema, induration, or nodules   Lab Results: Lab Results  Component Value Date   WBC 7.7 08/31/2012   HGB 10.7* 08/31/2012   HCT 32.8* 08/31/2012   MCV 75.6* 08/31/2012   PLT 138* 08/31/2012     Chemistry      Component Value Date/Time   NA 138 06/11/2012 0503   NA 142 04/11/2012 0950   K 3.9 06/11/2012 0503   K 3.9 04/11/2012 0950   CL 101 06/11/2012 0503   CL 105 04/11/2012 0950   CO2 27 06/11/2012 0503   CO2 29 04/11/2012 0950   BUN 21 06/11/2012 0503   BUN 20.0 04/11/2012 0950   CREATININE 0.92 06/11/2012 0503   CREATININE 0.9 04/11/2012 0950      Component Value Date/Time   CALCIUM 9.0 06/11/2012 0503   CALCIUM 9.1 04/11/2012 0950   ALKPHOS 50 05/31/2012 1358   ALKPHOS 60 04/11/2012 0950   AST 16 05/31/2012 1358   AST 12 04/11/2012 0950   ALT 9 05/31/2012 1358   ALT 7 04/11/2012 0950   BILITOT 0.4 05/31/2012 1358   BILITOT 0.37 04/11/2012 0950      Impression and Plan:  79 year old with the following issues:  1. Multifactorial anemia: He has an element of chronic disease, renal disease and possible B12 and iron deficiency.  S/P Feraheme on 05/18/12. Ferritin was >500 in May 2014. I will continue his Aransep on monthly basis to keep his Hgb above 11.   2. Elevated IgA in the past. I see no evidence of a a plasma cell disorder. He was found to have a benign bone lesion in the past.   3. COPD: he is followd be Dr. Debby Bud for that.   4. Follow up: in 4 months.    [place], [place] 6/12/20141:48 PM

## 2012-09-08 ENCOUNTER — Ambulatory Visit: Payer: PRIVATE HEALTH INSURANCE | Admitting: Physician Assistant

## 2012-09-21 ENCOUNTER — Ambulatory Visit: Payer: PRIVATE HEALTH INSURANCE | Admitting: Oncology

## 2012-09-26 ENCOUNTER — Encounter: Payer: Self-pay | Admitting: Physician Assistant

## 2012-09-26 ENCOUNTER — Telehealth: Payer: Self-pay | Admitting: *Deleted

## 2012-09-26 ENCOUNTER — Ambulatory Visit (INDEPENDENT_AMBULATORY_CARE_PROVIDER_SITE_OTHER): Payer: PRIVATE HEALTH INSURANCE | Admitting: Physician Assistant

## 2012-09-26 VITALS — BP 110/58 | HR 99 | Ht 66.0 in | Wt 127.0 lb

## 2012-09-26 DIAGNOSIS — I5032 Chronic diastolic (congestive) heart failure: Secondary | ICD-10-CM

## 2012-09-26 DIAGNOSIS — I4891 Unspecified atrial fibrillation: Secondary | ICD-10-CM

## 2012-09-26 DIAGNOSIS — J449 Chronic obstructive pulmonary disease, unspecified: Secondary | ICD-10-CM

## 2012-09-26 LAB — BASIC METABOLIC PANEL
BUN: 19 mg/dL (ref 6–23)
Chloride: 107 mEq/L (ref 96–112)
Glucose, Bld: 76 mg/dL (ref 70–99)
Potassium: 3.8 mEq/L (ref 3.5–5.1)
Sodium: 139 mEq/L (ref 135–145)

## 2012-09-26 NOTE — Progress Notes (Signed)
[address] [place], [place]  [address] Phone: [phone] Fax:  [phone]  Date:  09/26/2012   ID:  [name], [name] 04-06-1926, MRN [id number]  PCP:  Illene Regulus, MD  Cardiologist:  Dr. Sherryl Manges     History of Present Illness: [name] is a 79 y.o. male who returns for followup. He has a history of diastolic CHF, atrial fibrillation, pulmonary hypertension, tricuspid regurgitation, COPD, anemia of chronic disease, IgA MGUS, prostate cancer. CHADS2-VASc=3. Prior notes indicate that his is not on anticoagulation presumable due to hx of anemia.  Echo 2/12: Mild LVH, EF 55%, mild MR, mild BAE, moderate TR, PASP 56, small effusion.  Last seen by Dr. Sherryl Manges 08/24/12.  He was felt to be volume overloaded.  Lasix was adjusted and he was asked to follow up today.   He feels his LE edema is improved.  He decreased his Lasix back to 40 mg bid 6/20.  Denies orthopnea, PND.  No CP.  No syncope.  Breathing is stable.   Labs (3/14):  K 3.9, Cr 0.92, ALT 9, TSH 1.83 Labs (6/14):  Hgb 10.7, Plt 138K  Wt Readings from Last 3 Encounters:  09/26/12 127 lb (57.607 kg)  08/31/12 123 lb 8 oz (56.019 kg)  08/18/12 127 lb (57.607 kg)     Past Medical History  Diagnosis Date  . Atrial fibrillation   . Diagnosis unknown     lytic lesions-spine-biopsy negative?  . Prostate cancer   . Tricuspid regurgitation     pulmonary hypertension and mitral regurgitation  . Anemia, iron deficiency     f/u by hematology  . Measles   . Chronic obstructive pulmonary emphysema   . Fibrous dysplasia of bone     Bone scan Sept '11 - no mets; skeletal survey Sept '11  . Chronic diastolic CHF (congestive heart failure)     a. Echo 2/12: Mild LVH, EF 55%, mild MR, mild BAE, moderate TR, PASP 56, small effusion  . MGUS (monoclonal gammopathy of unknown significance)     Current Outpatient Prescriptions  Medication Sig Dispense Refill  . ADVAIR HFA 115-21 MCG/ACT inhaler  INHALE 2 PUFFS INTO THE LUNGS 2 (TWO) TIMES DAILY.  12 g  11  . albuterol (PROVENTIL HFA;VENTOLIN HFA) 108 (90 BASE) MCG/ACT inhaler Norins, Michael  8.5 each  2  . alendronate (FOSAMAX) 70 MG tablet Take 1 tablet (70 mg total) by mouth every 7 (seven) days. Take with a full glass of water on an empty stomach.  Taken on Mondays.  4 tablet  6  . aspirin EC 81 MG tablet Take 81 mg by mouth daily.      . Calcium Carbonate-Vitamin D (CALCIUM 600+D) 600-400 MG-UNIT per tablet Take 1 tablet by mouth daily.        . Cholecalciferol (VITAMIN D3) 1000 UNITS CAPS Take 1 capsule by mouth daily.        . cyanocobalamin (,VITAMIN B-12,) 1000 MCG/ML injection Inject 1,000 mcg into the muscle every 30 (thirty) days.       [name] diltiazem (CARDIZEM CD) 120 MG 24 hr capsule TAKE ONE CAPSULE BY MOUTH EVERY DAY  30 capsule  11  . feeding supplement (ENSURE COMPLETE) LIQD Take 237 mLs by mouth 2 (two) times daily between meals.  60 Bottle  11  . finasteride (PROSCAR) 5 MG tablet TAKE 1 TABLET BY MOUTH DAILY  30 tablet  1  . furosemide (LASIX) 40 MG tablet Take 2  tablets in the am and 1 tablet in the pm  90 tablet  6  . gabapentin (NEURONTIN) 300 MG capsule TAKE AT BEDTIME. WE CAN INCREASE THE DOSE AS NEEDED.  30 capsule  11  . GuaiFENesin 150 MG/15ML LIQD Take 30 mLs by mouth every 8 (eight) hours.  500 mL  3  . ipratropium-albuterol (DUONEB) 0.5-2.5 (3) MG/3ML SOLN Take 3 mLs by nebulization 4 (four) times daily.  360 mL  11  . tamsulosin (FLOMAX) 0.4 MG CAPS TAKE 1 CAPSULE BY MOUTH DAILY  30 capsule  6   No current facility-administered medications for this visit.    Allergies:   No Known Allergies  Social History:  The patient  reports that he quit smoking about 9 years ago. His smoking use included Cigarettes. He has a 30 pack-year smoking history. He has never used smokeless tobacco. He reports that  drinks alcohol. He reports that he does not use illicit drugs.   ROS:  Please see the history of present illness.    No melena, hematochezia, hemoptysis.  Cough is chronic and stable.   All other systems reviewed and negative.   PHYSICAL EXAM: VS:  BP 110/58  Pulse 99  Ht 5\' 6"  (1.676 m)  Wt 127 lb (57.607 kg)  BMI 20.51 kg/m2 Well nourished, well developed, in no acute distress HEENT: normal Neck: no JVD Cardiac:  normal S1, S2; RRR; no murmur Lungs:  Decreased breath sounds bilaterally, no wheezing, rhonchi or rales Abd: soft, nontender, no hepatomegaly Ext: 1+ bilateral ankle edema Skin: warm and dry Neuro:  CNs 2-12 intact, no focal abnormalities noted  EKG:  NSR, HR 99, RAA, LVH, no significant changes     ASSESSMENT AND PLAN:  1. Chronic Diastolic CHF:  He really has more right sided heart failure than anything else.  He has significant pulmonary HTN on echo with RVE.  Continue current dose of Lasix.  He can take an extra Lasix 40 mg in the AM if his edema increases.  We could always try changing him to [facility] if edema is hard to control.  Check BMET today.  2. Atrial Fibrillation:  Maintaining NSR.  He has not been felt to be a coumadin candidate in the past.  3. COPD:  F/u with PCP for management.   4. Anemia:  Managed by hematology.  5. Disposition:  Follow up with Dr. Sherryl Manges in 01/2013 as planned.  Return sooner PRN.   Signed, Tereso Newcomer, PA-C  09/26/2012 2:24 PM

## 2012-09-26 NOTE — Telephone Encounter (Signed)
Message copied by Tarri Fuller on Tue Sep 26, 2012  5:30 PM ------      Message from: [place], [place] T      Created: Tue Sep 26, 2012  5:28 PM       Potassium and kidney function ok      Continue with current treatment plan.      Tereso Newcomer, PA-C        09/26/2012 5:28 PM ------

## 2012-09-26 NOTE — Patient Instructions (Addendum)
STAY ON CURRENT DOSE OF LASIX 40 MG TWICE DAILY; HOWEVER IF YOU HAVE MORE SWELLING YOU CAN TAKE 80 MG THAT MORNING AND 40 MG IN THE EVENING  NO CHANGES WERE MADE TODAY  MAKE SURE TO FOLLOW UP WITH DR. Graciela Husbands IN 01/2013

## 2012-09-26 NOTE — Telephone Encounter (Signed)
lmom labs good, no changes to be made 

## 2012-09-28 ENCOUNTER — Other Ambulatory Visit (HOSPITAL_BASED_OUTPATIENT_CLINIC_OR_DEPARTMENT_OTHER): Payer: PRIVATE HEALTH INSURANCE | Admitting: Lab

## 2012-09-28 ENCOUNTER — Ambulatory Visit (HOSPITAL_BASED_OUTPATIENT_CLINIC_OR_DEPARTMENT_OTHER): Payer: PRIVATE HEALTH INSURANCE

## 2012-09-28 VITALS — BP 104/44 | HR 92 | Temp 97.5°F

## 2012-09-28 DIAGNOSIS — D649 Anemia, unspecified: Secondary | ICD-10-CM

## 2012-09-28 DIAGNOSIS — D638 Anemia in other chronic diseases classified elsewhere: Secondary | ICD-10-CM

## 2012-09-28 DIAGNOSIS — N189 Chronic kidney disease, unspecified: Secondary | ICD-10-CM

## 2012-09-28 LAB — CBC WITH DIFFERENTIAL/PLATELET
Basophils Absolute: 0 10*3/uL (ref 0.0–0.1)
EOS%: 2 % (ref 0.0–7.0)
HGB: 10.3 g/dL — ABNORMAL LOW (ref 13.0–17.1)
LYMPH%: 12.6 % — ABNORMAL LOW (ref 14.0–49.0)
MCH: 25 pg — ABNORMAL LOW (ref 27.2–33.4)
MCV: 77.2 fL — ABNORMAL LOW (ref 79.3–98.0)
MONO%: 7.4 % (ref 0.0–14.0)
Platelets: 146 10*3/uL (ref 140–400)
RBC: 4.13 10*6/uL — ABNORMAL LOW (ref 4.20–5.82)
RDW: 22 % — ABNORMAL HIGH (ref 11.0–14.6)

## 2012-09-28 MED ORDER — DARBEPOETIN ALFA-POLYSORBATE 300 MCG/0.6ML IJ SOLN
300.0000 ug | Freq: Once | INTRAMUSCULAR | Status: AC
  Administered 2012-09-28: 300 ug via SUBCUTANEOUS
  Filled 2012-09-28: qty 0.6

## 2012-10-09 ENCOUNTER — Other Ambulatory Visit: Payer: Self-pay | Admitting: Internal Medicine

## 2012-10-12 ENCOUNTER — Telehealth: Payer: Self-pay | Admitting: Physician Assistant

## 2012-10-12 ENCOUNTER — Encounter (HOSPITAL_COMMUNITY): Payer: Self-pay | Admitting: *Deleted

## 2012-10-12 ENCOUNTER — Emergency Department (HOSPITAL_COMMUNITY): Payer: PRIVATE HEALTH INSURANCE

## 2012-10-12 ENCOUNTER — Inpatient Hospital Stay (HOSPITAL_COMMUNITY)
Admission: EM | Admit: 2012-10-12 | Discharge: 2012-10-14 | DRG: 190 | Disposition: A | Discharge status: Home Health | Payer: PRIVATE HEALTH INSURANCE | Attending: Internal Medicine | Admitting: Internal Medicine

## 2012-10-12 DIAGNOSIS — D72829 Elevated white blood cell count, unspecified: Secondary | ICD-10-CM | POA: Diagnosis present

## 2012-10-12 DIAGNOSIS — I10 Essential (primary) hypertension: Secondary | ICD-10-CM

## 2012-10-12 DIAGNOSIS — M889 Osteitis deformans of unspecified bone: Secondary | ICD-10-CM | POA: Diagnosis present

## 2012-10-12 DIAGNOSIS — J449 Chronic obstructive pulmonary disease, unspecified: Secondary | ICD-10-CM

## 2012-10-12 DIAGNOSIS — M898X9 Other specified disorders of bone, unspecified site: Secondary | ICD-10-CM | POA: Diagnosis present

## 2012-10-12 DIAGNOSIS — N400 Enlarged prostate without lower urinary tract symptoms: Secondary | ICD-10-CM

## 2012-10-12 DIAGNOSIS — C61 Malignant neoplasm of prostate: Secondary | ICD-10-CM

## 2012-10-12 DIAGNOSIS — I5032 Chronic diastolic (congestive) heart failure: Secondary | ICD-10-CM

## 2012-10-12 DIAGNOSIS — R0602 Shortness of breath: Secondary | ICD-10-CM

## 2012-10-12 DIAGNOSIS — M7989 Other specified soft tissue disorders: Secondary | ICD-10-CM | POA: Diagnosis present

## 2012-10-12 DIAGNOSIS — Z8546 Personal history of malignant neoplasm of prostate: Secondary | ICD-10-CM

## 2012-10-12 DIAGNOSIS — D649 Anemia, unspecified: Secondary | ICD-10-CM

## 2012-10-12 DIAGNOSIS — R0600 Dyspnea, unspecified: Secondary | ICD-10-CM

## 2012-10-12 DIAGNOSIS — I509 Heart failure, unspecified: Secondary | ICD-10-CM | POA: Diagnosis present

## 2012-10-12 DIAGNOSIS — M899 Disorder of bone, unspecified: Secondary | ICD-10-CM | POA: Diagnosis present

## 2012-10-12 DIAGNOSIS — Z87891 Personal history of nicotine dependence: Secondary | ICD-10-CM

## 2012-10-12 DIAGNOSIS — Z681 Body mass index (BMI) 19 or less, adult: Secondary | ICD-10-CM

## 2012-10-12 DIAGNOSIS — I4891 Unspecified atrial fibrillation: Secondary | ICD-10-CM

## 2012-10-12 DIAGNOSIS — D638 Anemia in other chronic diseases classified elsewhere: Secondary | ICD-10-CM | POA: Diagnosis present

## 2012-10-12 DIAGNOSIS — I5033 Acute on chronic diastolic (congestive) heart failure: Secondary | ICD-10-CM | POA: Diagnosis present

## 2012-10-12 DIAGNOSIS — I2789 Other specified pulmonary heart diseases: Secondary | ICD-10-CM | POA: Diagnosis present

## 2012-10-12 DIAGNOSIS — J441 Chronic obstructive pulmonary disease with (acute) exacerbation: Principal | ICD-10-CM | POA: Diagnosis present

## 2012-10-12 DIAGNOSIS — E43 Unspecified severe protein-calorie malnutrition: Secondary | ICD-10-CM

## 2012-10-12 LAB — CBC WITH DIFFERENTIAL/PLATELET
Eosinophils Absolute: 0.1 10*3/uL (ref 0.0–0.7)
Eosinophils Relative: 1 % (ref 0–5)
Hemoglobin: 10.7 g/dL — ABNORMAL LOW (ref 13.0–17.0)
Lymphs Abs: 0.9 10*3/uL (ref 0.7–4.0)
MCH: 24.2 pg — ABNORMAL LOW (ref 26.0–34.0)
MCV: 77.6 fL — ABNORMAL LOW (ref 78.0–100.0)
Monocytes Relative: 5 % (ref 3–12)
RBC: 4.42 MIL/uL (ref 4.22–5.81)

## 2012-10-12 LAB — CG4 I-STAT (LACTIC ACID): Lactic Acid, Venous: 2.01 mmol/L (ref 0.5–2.2)

## 2012-10-12 LAB — COMPREHENSIVE METABOLIC PANEL
Alkaline Phosphatase: 56 U/L (ref 39–117)
BUN: 17 mg/dL (ref 6–23)
Calcium: 9.2 mg/dL (ref 8.4–10.5)
GFR calc Af Amer: >90 mL/min (ref >90)
Glucose, Bld: 81 mg/dL (ref 70–99)
Total Protein: 7.4 g/dL (ref 6.0–8.3)

## 2012-10-12 LAB — POCT I-STAT TROPONIN I

## 2012-10-12 LAB — PRO B NATRIURETIC PEPTIDE: Pro B Natriuretic peptide (BNP): 1131 pg/mL — ABNORMAL HIGH (ref 0–450)

## 2012-10-12 MED ORDER — ALBUTEROL SULFATE (5 MG/ML) 0.5% IN NEBU
2.5000 mg | INHALATION_SOLUTION | Freq: Once | RESPIRATORY_TRACT | Status: AC
  Administered 2012-10-12: 2.5 mg via RESPIRATORY_TRACT
  Filled 2012-10-12: qty 0.5

## 2012-10-12 MED ORDER — SODIUM CHLORIDE 0.9 % IV BOLUS (SEPSIS): 500.0000 mL | Freq: Once | INTRAVENOUS | Status: DC

## 2012-10-12 NOTE — ED Provider Notes (Signed)
Patient severe shortness of breath earlier this morning. He was treated with extra Lasix he presents presently, breathing is improved however had blood pressure of 90/40 at home. He admits to orthopnea. On exam no respiratory distress lung with rales at bases neck with positive neck vein distention bilateral lower sternum 2+ pretibial pitting edema  Doug Sou, MD 10/12/12 2133

## 2012-10-12 NOTE — ED Notes (Signed)
Pt coming from home with c/o increased sob since this am and LE edema. Pt with hx of chf

## 2012-10-12 NOTE — Telephone Encounter (Signed)
Nurse practitioner providing a wellness check on the patient at his residence called the answering svc regarding the patient appear volume overloaded and complaining of an episode of dyspnea last night. She reports he has complained of worsening LE edema and shortness of breath. He has underlying COPD, anemia, TR, PAH and chronic diastolic CHF/mostly R-sided CHF. He took home inhalers with minimal relief. He has continued Lasix 40 BID. He recently followed up in 08/2012 with Dr. Graciela Husbands. He was noted to be volume overloaded at that time. Lasix was increased to 80 am, 40 pm with good output and symptomatic improvement. This was self-reduced back to 40 bid on 6/20. He was noted to be fairly stable on follow-up with Tereso Newcomer, PA-C on 7/8. Will plan to increase back to 80 am, 40 pm. Advised to monitor output, shortness of breath and swelling during this time. Scott recommended switching to Demadex if increased diuresis needed which is a good option should the increased Lasix fail to relieve symptoms. Advised to call back or present to the nearest ED if symptoms worsen. She understood and agreed.    Jacqulyn Bath, PA-C 10/12/2012 5:25 PM

## 2012-10-12 NOTE — ED Notes (Signed)
Gave I-stat CG4 to Dr Gordy Savers

## 2012-10-12 NOTE — ED Notes (Signed)
Pt sitting up on side of bed eating a sandwich and a cup coffee
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
## 2012-10-13 ENCOUNTER — Telehealth: Payer: Self-pay

## 2012-10-13 DIAGNOSIS — N400 Enlarged prostate without lower urinary tract symptoms: Secondary | ICD-10-CM

## 2012-10-13 DIAGNOSIS — J449 Chronic obstructive pulmonary disease, unspecified: Secondary | ICD-10-CM

## 2012-10-13 DIAGNOSIS — J441 Chronic obstructive pulmonary disease with (acute) exacerbation: Principal | ICD-10-CM

## 2012-10-13 DIAGNOSIS — R0609 Other forms of dyspnea: Secondary | ICD-10-CM

## 2012-10-13 DIAGNOSIS — I5032 Chronic diastolic (congestive) heart failure: Secondary | ICD-10-CM

## 2012-10-13 DIAGNOSIS — I369 Nonrheumatic tricuspid valve disorder, unspecified: Secondary | ICD-10-CM

## 2012-10-13 DIAGNOSIS — I10 Essential (primary) hypertension: Secondary | ICD-10-CM

## 2012-10-13 DIAGNOSIS — I4891 Unspecified atrial fibrillation: Secondary | ICD-10-CM

## 2012-10-13 LAB — COMPREHENSIVE METABOLIC PANEL
ALT: 5 U/L (ref 0–53)
AST: 11 U/L (ref 0–37)
Alkaline Phosphatase: 54 U/L (ref 39–117)
CO2: 30 mEq/L (ref 19–32)
Calcium: 8.9 mg/dL (ref 8.4–10.5)
Chloride: 102 mEq/L (ref 96–112)
GFR calc Af Amer: >90 mL/min (ref >90)
GFR calc non Af Amer: 79 mL/min — ABNORMAL LOW (ref >90)
Glucose, Bld: 84 mg/dL (ref 70–99)
Potassium: 3.4 mEq/L — ABNORMAL LOW (ref 3.5–5.1)
Sodium: 141 mEq/L (ref 135–145)
Total Bilirubin: 0.5 mg/dL (ref 0.3–1.2)

## 2012-10-13 LAB — CBC
Hemoglobin: 10.3 g/dL — ABNORMAL LOW (ref 13.0–17.0)
MCH: 24.4 pg — ABNORMAL LOW (ref 26.0–34.0)
Platelets: 156 10*3/uL (ref 150–400)
RBC: 4.22 MIL/uL (ref 4.22–5.81)
WBC: 8.2 10*3/uL (ref 4.0–10.5)

## 2012-10-13 MED ORDER — ENSURE COMPLETE PO LIQD
237.0000 mL | Freq: Three times a day (TID) | ORAL | Status: DC
  Administered 2012-10-13 – 2012-10-14 (×4): 237 mL via ORAL

## 2012-10-13 MED ORDER — MOMETASONE FURO-FORMOTEROL FUM 100-5 MCG/ACT IN AERO
2.0000 | INHALATION_SPRAY | Freq: Two times a day (BID) | RESPIRATORY_TRACT | Status: DC
  Administered 2012-10-13 – 2012-10-14 (×4): 2 via RESPIRATORY_TRACT
  Filled 2012-10-13: qty 8.8

## 2012-10-13 MED ORDER — DILTIAZEM HCL ER COATED BEADS 120 MG PO CP24
120.0000 mg | ORAL_CAPSULE | Freq: Every day | ORAL | Status: DC
  Administered 2012-10-13 – 2012-10-14 (×2): 120 mg via ORAL
  Filled 2012-10-13 (×2): qty 1

## 2012-10-13 MED ORDER — ALBUTEROL SULFATE (5 MG/ML) 0.5% IN NEBU
2.5000 mg | INHALATION_SOLUTION | Freq: Four times a day (QID) | RESPIRATORY_TRACT | Status: DC
  Administered 2012-10-13: 2.5 mg via RESPIRATORY_TRACT
  Filled 2012-10-13: qty 0.5

## 2012-10-13 MED ORDER — ENSURE COMPLETE PO LIQD: 237.0000 mL | Freq: Two times a day (BID) | ORAL | Status: DC

## 2012-10-13 MED ORDER — FUROSEMIDE 10 MG/ML IJ SOLN
40.0000 mg | Freq: Two times a day (BID) | INTRAMUSCULAR | Status: DC
  Administered 2012-10-13 – 2012-10-14 (×4): 40 mg via INTRAVENOUS
  Filled 2012-10-13 (×6): qty 4

## 2012-10-13 MED ORDER — ADULT MULTIVITAMIN W/MINERALS CH
1.0000 | ORAL_TABLET | Freq: Every day | ORAL | Status: DC
  Administered 2012-10-13 – 2012-10-14 (×2): 1 via ORAL
  Filled 2012-10-13 (×2): qty 1

## 2012-10-13 MED ORDER — FINASTERIDE 5 MG PO TABS
5.0000 mg | ORAL_TABLET | Freq: Every day | ORAL | Status: DC
  Administered 2012-10-13 – 2012-10-14 (×2): 5 mg via ORAL
  Filled 2012-10-13 (×2): qty 1

## 2012-10-13 MED ORDER — ALBUTEROL SULFATE (5 MG/ML) 0.5% IN NEBU
2.5000 mg | INHALATION_SOLUTION | Freq: Four times a day (QID) | RESPIRATORY_TRACT | Status: DC
  Administered 2012-10-13 – 2012-10-14 (×6): 2.5 mg via RESPIRATORY_TRACT
  Filled 2012-10-13 (×7): qty 0.5

## 2012-10-13 MED ORDER — ASPIRIN EC 81 MG PO TBEC
81.0000 mg | DELAYED_RELEASE_TABLET | Freq: Every day | ORAL | Status: DC
  Administered 2012-10-13 – 2012-10-14 (×2): 81 mg via ORAL
  Filled 2012-10-13 (×2): qty 1

## 2012-10-13 MED ORDER — ALBUTEROL SULFATE (5 MG/ML) 0.5% IN NEBU: 2.5000 mg | INHALATION_SOLUTION | RESPIRATORY_TRACT | Status: DC | PRN

## 2012-10-13 MED ORDER — HEPARIN SODIUM (PORCINE) 5000 UNIT/ML IJ SOLN
5000.0000 [IU] | Freq: Two times a day (BID) | INTRAMUSCULAR | Status: DC
  Administered 2012-10-13 – 2012-10-14 (×3): 5000 [IU] via SUBCUTANEOUS
  Filled 2012-10-13 (×4): qty 1

## 2012-10-13 MED ORDER — IPRATROPIUM BROMIDE 0.02 % IN SOLN
0.5000 mg | Freq: Four times a day (QID) | RESPIRATORY_TRACT | Status: DC
  Administered 2012-10-13: 0.5 mg via RESPIRATORY_TRACT
  Filled 2012-10-13: qty 2.5

## 2012-10-13 MED ORDER — IPRATROPIUM BROMIDE 0.02 % IN SOLN
0.5000 mg | Freq: Four times a day (QID) | RESPIRATORY_TRACT | Status: DC
  Administered 2012-10-13 – 2012-10-14 (×6): 0.5 mg via RESPIRATORY_TRACT
  Filled 2012-10-13 (×7): qty 2.5

## 2012-10-13 MED ORDER — IPRATROPIUM-ALBUTEROL 0.5-2.5 (3) MG/3ML IN SOLN: 3.0000 mL | Freq: Four times a day (QID) | RESPIRATORY_TRACT | Status: DC

## 2012-10-13 MED ORDER — DOXYCYCLINE HYCLATE 100 MG PO TABS
100.0000 mg | ORAL_TABLET | Freq: Two times a day (BID) | ORAL | Status: DC
  Administered 2012-10-13 – 2012-10-14 (×4): 100 mg via ORAL
  Filled 2012-10-13 (×5): qty 1

## 2012-10-13 MED ORDER — ONDANSETRON HCL 4 MG/2ML IJ SOLN: 4.0000 mg | Freq: Three times a day (TID) | INTRAMUSCULAR | Status: AC | PRN

## 2012-10-13 NOTE — Care Management Note (Addendum)
    Page 1 of 1   10/16/2012     2:03:33 PM   CARE MANAGEMENT NOTE 10/16/2012  Patient:  AARO, MEYERS   Account Number:  [id number]  Date Initiated:  10/13/2012  Documentation initiated by:  [facility]  Subjective/Objective Assessment:   ADMITTED W/SOB.CHF.     Action/Plan:   FROM HOME-INDEP LIV.RETIREMENT COMMUNITY.   Anticipated DC Date:  10/14/2012   Anticipated DC Plan:  HOME/SELF CARE      DC Planning Services  CM consult      Choice offered to / List presented to:             Status of service:  Completed, signed off Medicare Important Message given?   (If response is "NO", the following Medicare IM given date fields will be blank) Date Medicare IM given:   Date Additional Medicare IM given:    Discharge Disposition:  HOME/SELF CARE  Per UR Regulation:  Reviewed for med. necessity/level of care/duration of stay  If discussed at Long Length of Stay Meetings, dates discussed:    Comments:  10/16/12 Seleena Reimers RN,BSN NCM [phone] UPON REVIEW,NO PT F/U.D/C HOME NO ORDERS OR NEEDS.  10/13/12 Mirl Hillery RN,BSN NCM [phone] AWAIT PT RECOMMENDATIONS.

## 2012-10-13 NOTE — ED Provider Notes (Signed)
Medical screening examination/treatment/procedure(s) were conducted as a shared visit with non-physician practitioner(s) and myself.  I personally evaluated the patient during the encounter  Emlyn Maves, MD 10/13/12 0056 

## 2012-10-13 NOTE — Progress Notes (Signed)
PT NOTE  Order received. Chart reviewed. Pt is currently on bed rest. Please update activity level in order set. Thanks.  Rebeca Alert, PT [phone]

## 2012-10-13 NOTE — Telephone Encounter (Signed)
ok 

## 2012-10-13 NOTE — Evaluation (Signed)
Physical Therapy Evaluation Patient Details Name: [name] MRN: [id number] DOB: 1926-06-04 Today's Date: 10/13/2012 Time: 6644-0347 PT Time Calculation (min): 17 min  PT Assessment / Plan / Recommendation History of Present Illness  79 yo male is admitted for dyspnea that is multi-factorial. He had an acute episode of dyspnea and in the ED he was SOB, BNP to 1100, CXR with - question of multi-focal infection in setting of advance emphysema. He has also been loosing weight - approximately 8 lbs since the beginning of the month when he was seen in cardiology  Clinical Impression  On eval, pt required Min guard assist for mobility-able to ambulate ~135 feet with straight cane. Pt uses cane for shorter distances and rollator for longer distances. Will follow during stay but do not anticipate any follow up PT needs at discharge.     PT Assessment  Patient needs continued PT services    Follow Up Recommendations  No PT follow up    Does the patient have the potential to tolerate intense rehabilitation      Barriers to Discharge        Equipment Recommendations  None recommended by PT    Recommendations for Other Services OT consult   Frequency Min 3X/week    Precautions / Restrictions Precautions Precautions: Fall Restrictions Weight Bearing Restrictions: No   Pertinent Vitals/Pain No c/o pain      Mobility  Bed Mobility Bed Mobility: Not assessed Transfers Transfers: Sit to Stand;Stand to Sit Sit to Stand: 5: Supervision;From chair/3-in-1;With armrests Stand to Sit: 5: Supervision;To chair/3-in-1;With armrests Ambulation/Gait Ambulation/Gait Assistance: 4: Min guard Ambulation Distance (Feet): 135 Feet Assistive device: Straight cane Ambulation/Gait Assistance Details: Slightly unsteady but no LOB Gait Pattern: Trunk flexed;Decreased stride length;Step-through pattern    Exercises     PT Diagnosis: Generalized weakness  PT Problem List: Decreased  mobility PT Treatment Interventions: DME instruction;Gait training;Functional mobility training;Therapeutic activities;Therapeutic exercise;Patient/family education     PT Goals(Current goals can be found in the care plan section) Acute Rehab PT Goals Patient Stated Goal: home PT Goal Formulation: With patient Time For Goal Achievement: 10/20/12 Potential to Achieve Goals: Good  Visit Information  Last PT Received On: 10/13/12 Assistance Needed: +1 History of Present Illness: [name] is admitted for dyspnea that is multi-factorial. He had an acute episode of dyspnea and in the ED he was SOB, BNP to 1100, CXR with - question of multi-focal infection in setting of advance emphysema. He has also been loosing weight - approximately 8 lbs since the beginning of the month when he was seen in cardiology       Prior Functioning  Home Living Family/patient expects to be discharged to:: Private residence Living Arrangements: Alone Type of Home: Apartment Home Access: Level entry;Elevator Home Layout: One level Home Equipment: Walker - 4 wheels;Cane - single point Prior Function Level of Independence: Independent Communication Communication: No difficulties    Cognition  Cognition Arousal/Alertness: Awake/alert Behavior During Therapy: WFL for tasks assessed/performed Overall Cognitive Status: Within Functional Limits for tasks assessed    Extremity/Trunk Assessment Upper Extremity Assessment Upper Extremity Assessment: Overall WFL for tasks assessed Lower Extremity Assessment Lower Extremity Assessment: Generalized weakness Cervical / Trunk Assessment Cervical / Trunk Assessment: Kyphotic   Balance    End of Session PT - End of Session Equipment Utilized During Treatment: Gait belt Activity Tolerance: Patient tolerated treatment well Patient left: in chair;with call bell/phone within reach;with chair alarm set  GP Functional Assessment Tool Used: clinical  judgement  Functional Limitation: Mobility: Walking and moving around Mobility: Walking and Moving Around Current Status [phone]): At least 1 percent but less than 20 percent impaired, limited or restricted Mobility: Walking and Moving Around Goal Status [phone]): 0 percent impaired, limited or restricted   Rebeca Alert, MPT Pager: [phone]

## 2012-10-13 NOTE — Progress Notes (Signed)
  Echocardiogram 2D Echocardiogram has been performed.  [name] 10/13/2012, 10:15 AM

## 2012-10-13 NOTE — Progress Notes (Signed)
Subjective: [name] is admitted for dyspnea that is multi-factorial. He had an acute episode of dyspnea and in the ED he was SOB, BNP to 1100, CXR with - question of multi-focal infection in setting of advance emphysema. He has also been loosing weight - approximately 8 lbs since the beginning of the month when he was seen in cardiology.  This AM he is feeling better - less short of breath. Has no chest pain or other focal complaints   Objective: Lab:  Recent Labs  10/12/12 1920 10/13/12 0435  WBC 10.6* 8.2  NEUTROABS 9.0*  --   HGB 10.7* 10.3*  HCT 34.3* 32.6*  MCV 77.6* 77.3*  PLT 169 156    Recent Labs  10/12/12 1920 10/13/12 0435  NA 140 141  K 3.5 3.4*  CL 103 102  GLUCOSE 81 84  BUN 17 18  CREATININE 0.77 0.84  CALCIUM 9.2 8.9   Trop I Neg x 4; BNP  1131 Imaging: CXR 7/24: IMPRESSION:  1. Interval development of heterogeneous air space opacities  within the left lower lung with additional slightly nodular air  space opacity within the left mid lung. Overall findings worrisome  for multifocal infection but given advanced underlying  emphysematous change, a follow-up chest radiograph in 4 to 6 weeks  after treatment is recommended to ensure complete resolution.  2. Grossly unchanged expansile lesion involving the posterior  lateral aspect of the right seventh rib, similar when compared to  remote chest CT and compatible with provided history of fibrous  dysplasia.   Scheduled Meds: . albuterol  2.5 mg Nebulization QID  . aspirin EC  81 mg Oral Daily  . diltiazem  120 mg Oral Daily  . doxycycline  100 mg Oral Q12H  . finasteride  5 mg Oral Daily  . furosemide  40 mg Intravenous BID  . heparin  5,000 Units Subcutaneous Q12H  . ipratropium  0.5 mg Nebulization QID  . mometasone-formoterol  2 puff Inhalation BID  . sodium chloride  500 mL Intravenous Once   Continuous Infusions:  PRN Meds:.albuterol, ondansetron (ZOFRAN) IV   Physical Exam: Filed  Vitals:   10/13/12 0552  BP: 146/76  Pulse: 86  Temp: 98.4 F (36.9 C)  Resp: 20    Intake/Output Summary (Last 24 hours) at 10/13/12 0810 Last data filed at 10/13/12 0545  Gross per 24 hour  Intake      0 ml  Output   1385 ml  Net  -1385 ml   Gen'l - cachetic AA man in no distress HEENT - temporal wasting, & S clear, arcus senilis Nec- thin, \ Cor  2+ radial, heart sounds distant but regular, no JVD Chest - mild kyphosis and very prominent skeletal appearance Lung - decrease breath sounds, no rales or wheeze Abd - scaphoid, BS + Neuro - A&O x 3, cognition normal       Assessment/Plan: 1. Cardiac - diastolic heart failure with decompensation. He has diuresed 1+ liters. Respirations are better. CE - negative Plan 2D echo  Continue lasix today  Increase activity  2. Pul- advance COPD now with exacerbation with mild leukocytosis and abnormal CXR with patchy infiltrates. Day # 2 Doxcycline - WBC down to normal Plan Continue O2 and home meds  3. A. Fib - good rate control vs NSR  Plan Continue home meds  4. Heme/Onc - follow for Paget's and MGUS with chronic anemia. He is followed by Dr. Clelia Croft.  5. Weight loss - he reports good  appetite, no GI symptoms Plan - nutrition consult  Ensure tid.     Illene Regulus [facility] (o) [phone]; (c) [phone] Call-grp - [facility]  Tele: [phone]  10/13/2012, 8:09 AM
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
## 2012-10-13 NOTE — Telephone Encounter (Signed)
Phone call from [place], case manager [phone] needs patients observation status changed to in patient status. Just needs verbal to do so. Thanks.

## 2012-10-13 NOTE — Progress Notes (Signed)
Patient had 6 beats of V-tach around this time. V/S stable, no CP or SOB. MD on call notified, no further interventions put in place. Will continue to monitor. Leeroy Cha RN

## 2012-10-14 DIAGNOSIS — E43 Unspecified severe protein-calorie malnutrition: Secondary | ICD-10-CM | POA: Insufficient documentation

## 2012-10-14 LAB — BASIC METABOLIC PANEL
BUN: 24 mg/dL — ABNORMAL HIGH (ref 6–23)
Calcium: 9.5 mg/dL (ref 8.4–10.5)
Chloride: 99 mEq/L (ref 96–112)
Creatinine, Ser: 0.99 mg/dL (ref 0.50–1.35)
GFR calc Af Amer: 85 mL/min — ABNORMAL LOW (ref >90)
GFR calc non Af Amer: 74 mL/min — ABNORMAL LOW (ref >90)

## 2012-10-14 MED ORDER — DOXYCYCLINE HYCLATE 100 MG PO TABS: 100.0000 mg | ORAL_TABLET | Freq: Two times a day (BID) | ORAL | Status: DC

## 2012-10-14 MED ORDER — ENSURE COMPLETE PO LIQD: 237.0000 mL | Freq: Three times a day (TID) | ORAL | Status: DC

## 2012-10-14 NOTE — Progress Notes (Signed)
Subjective: [name] is feeling much better. He is wanting to go home  Objective: Lab:  Recent Labs  10/12/12 1920 10/13/12 0435  WBC 10.6* 8.2  NEUTROABS 9.0*  --   HGB 10.7* 10.3*  HCT 34.3* 32.6*  MCV 77.6* 77.3*  PLT 169 156    Recent Labs  10/12/12 1920 10/13/12 0435 10/14/12 0452  NA 140 141 138  K 3.5 3.4* 3.8  CL 103 102 99  GLUCOSE 81 84 89  BUN 17 18 24*  CREATININE 0.77 0.84 0.99  CALCIUM 9.2 8.9 9.5    Imaging: 2D echo:Study Conclusions  - Left ventricle: The cavity size was normal. Wall thickness was increased in a pattern of mild LVH. Systolic function was normal. The estimated ejection fraction was in the range of 60% to 65%. Wall motion was normal; there were no regional wall motion abnormalities. Doppler parameters are consistent with abnormal left ventricular relaxation (grade 1 diastolic dysfunction). - Aortic valve: There was no stenosis. - Mitral valve: No significant regurgitation. - Left atrium: The atrium was moderately dilated. - Right ventricle: The cavity size was normal. Systolic function was normal. - Right atrium: The atrium was mildly dilated. - Tricuspid valve: Mild-moderate regurgitation. - Pulmonary arteries: PA systolic pressure 43-47 mmHg. - Systemic veins: IVC measured 1.8 cm with normal respirophasic variation, suggesting RA pressure 6-10 mmHg. - Pericardium, extracardiac: A trivial pericardial effusion was identified. Impressions:  - Normal LV size and systolic function, EF 60-65%. Mild LV hypertrophy. Biatrial enlargement. Normal RV size and systolic function. Mild pulmonary hypertension.  Scheduled Meds: . albuterol  2.5 mg Nebulization QID  . aspirin EC  81 mg Oral Daily  . diltiazem  120 mg Oral Daily  . doxycycline  100 mg Oral Q12H  . feeding supplement  237 mL Oral TID BM  . finasteride  5 mg Oral Daily  . furosemide  40 mg Intravenous BID  . heparin  5,000 Units Subcutaneous Q12H  . ipratropium  0.5  mg Nebulization QID  . mometasone-formoterol  2 puff Inhalation BID  . multivitamin with minerals  1 tablet Oral Q1200  . sodium chloride  500 mL Intravenous Once   Continuous Infusions:  PRN Meds:.albuterol   Physical Exam: Filed Vitals:   10/14/12 0523  BP: 96/60  Pulse: 90  Temp: 98.6 F (37 C)  Resp: 18  O2 sat 94% RA   Intake/Output Summary (Last 24 hours) at 10/14/12 1037 Last data filed at 10/14/12 0500  Gross per 24 hour  Intake    480 ml  Output   2175 ml  Net  -1695 ml   Total this adm: -2840  See d/c summary    Assessment/Plan: For d/c home. D/c dictation done # [id number]   Illene Regulus  [facility] (o) [phone]; (c) [phone] Call-grp - [facility]  Tele: [phone]  10/14/2012, 10:36 AM

## 2012-10-14 NOTE — Discharge Summary (Signed)
[name], Dave NO.:  [id number]  MEDICAL RECORD NO.:  [id number]  LOCATION:  1421                         FACILITY:  [facility]  PHYSICIAN:  Dave Gess. Norins, MD  DATE OF BIRTH:  October 27, 1926  DATE OF ADMISSION:  10/12/2012 DATE OF DISCHARGE:  10/14/2012                              DISCHARGE SUMMARY   ADMITTING DIAGNOSES: 1. Severe dyspnea multifactorial. 2. Acute exacerbation of chronic obstructive pulmonary disease. 3. History of atrial fibrillation. 4. History of diastolic heart failure. 5. History of prostate cancer. 6. Significant weight loss with severe calorie protein malnutrition. 7. Microcytic anemia chronic.  DISCHARGE DIAGNOSES: 1. Severe dyspnea multifactorial. 2. Acute exacerbation of chronic obstructive pulmonary disease. 3. History of atrial fibrillation. 4. History of diastolic heart failure. 5. History of prostate cancer. 6. Significant weight loss with severe calorie protein malnutrition. 7. Microcytic anemia chronic.  CONSULTANTS:  None.  PROCEDURES: 1. Chest x-ray, October 12, 2012, read out as interval development of     heterogeneous airspace opacities within the left lower lung with     additional slight nodular airspace opacity within the left mid     lung.  Findings worrisome for multifocal infection. 2. Grossly unchanged expansile lesion involving the posterolateral     aspect of the right 7th rib, unchanged from previous CT. 3. A 2D echo performed October 13, 2012, left ventricle with normal     cavity size.  Mild LVH.  EF of 60-65% with normal wall motion.     Abnormal left ventricular relaxation with grade 1 diastolic     dysfunction.  No valvular disease.  HISTORY OF PRESENT ILLNESS:  [name] is an 79 year old African American gentleman with a known history of diastolic heart failure, atrial fibrillation, pulmonary hypertension, tricuspid regurgitation, emphysema, anemia chronic disease, prostate cancer.  The  patient presented with onset of severe shortness of breath between 4:30 and 5 a.m. on the morning of admission, which he reports was the worst shortness of breath episode he has had.  Nebulizer treatment at home failed to relieve his symptoms.  A nurse from [employer] made a home visit, noticed he had increasing lower extremity swelling and breathing issues and recommended hospitalization.  The patient presented to the emergency department where he was found to be significantly short of breath with x-ray revealing possible multifocal infection and he was subsequently admitted for IV antibiotics and respiratory care.  Please see the H and P for past medical history, family history, social history, and examination.  HOSPITAL COURSE: 1. Cardiac.  The patient with decompensation of diastolic heart     failure.  He was given IV Lasix for diuresis and successfully     diuresed 2.8 L.  With this, he had significantly improved     respiratory function.  A 2D echo was performed, which showed no     significant progressive diastolic heart failure or change in heart     function.  The patient with a good diuresis is at this point      stable to be discharged home on increased dose of Lasix.  2. Pulmonary.  The patient with advanced emphysema, now with acute  exacerbation with mild leukocytosis and abnormal chest x-ray.  He     was started on doxycycline at admission.  White count returned to     normal.  The patient's oxygen saturations were 94% on room air with     no increased work of breathing.    Plan for patient to be discharged     to home to continue a course of doxycycline.  3. Atrial fibrillation.  The patient remained in atrial fibrillation     with a well-controlled rate.  4. Heme/Onc.  The patient is followed for Paget's type disease and     MGUS with chronic anemia.  He does see Dr. Clelia Croft as an outpatient.  5. Weight loss.  The patient has had significant weight  loss over the     last month losing 8 pounds or greater than 6% of his total body     weight.  Nutrition consult was called.  It was recommended that he     have additional nutritional support with Ensure t.i.d.  6. ADLs.  The patient was seen by PT and felt the patient was able to     manage with continued home physical therapy.  The patient with problems as above, currently stable.  He is ready for discharge at this time with close followup with office visit in 5-7 days.  DISCHARGE PHYSICAL EXAMINATION:  VITAL SIGNS:  Temperature was 98.6, blood pressure was 96/60, pulse was 90, respirations 18, O2 sat 95% on room air. GENERAL APPEARANCE:  This is an elderly African American gentleman who looks very thin, who is in no acute distress. HEENT:  The patient with mild temporal wasting.  Conjunctivae and sclerae were clear. NECK:  Supple. CHEST:  The patient has mild kyphosis. PULMONARY:  The patient has no increased work of breathing.  He is moving air well.  He has no wheezing. CARDIOVASCULAR:  Radial pulses 2+.  Precordium was quiet.  His heart sounds were distant.  His heart rate was rate controlled with a very modest irregular rhythm.  No murmurs were appreciated. ABDOMEN:  Scaphoid with no masses, no tenderness. GENITALIA:  Deferred. EXTREMITIES:  Thin but no deformity was noted.  FINAL LABORATORY DATA:  From the day of discharge, sodium 138, potassium 3.8, chloride was 99, CO2 is 34, BUN was 24, creatinine 0.99, glucose was 89.  Liver functions from July 25 were normal except for a low albumin.  CBC from July 25 with a white count of 8200, hemoglobin of 10.3 g, platelet count 156,000.  DISCHARGE MEDICATIONS: 1. Albuterol metered dose inhaler 2 puffs every 4 hours as needed. 2. Fosamax 70 mg once a week. 3. Aspirin 81 mg daily. 4. Vitamin D3 1000 international units daily. 5. Diltiazem CD 120 mg once daily. 6. Doxycycline 100 mg b.i.d. for an additional 5 days. 7. Ensure  Complete 3 cans daily. 8. Proscar 5 mg daily. 9. Advair 115/21, 2 puffs b.i.d. 10.Lasix 80 mg q.a.m., 40 mg q.p.m. 11.DuoNeb at home with a nebulizer machine 1 treatment 4 times daily. 12.Tamsulosin 0.4 mg 1 capsule daily.  DISPOSITION:  The patient is to be discharged home, but continue on his medications as outlined.  He will be seen in the office in 5-7 days. Condition at the time of discharge is stable with a guarded prognosis given his age and advanced medical problems.     Dave Gess Norins, MD     MEN/MEDQ  D:  10/14/2012  T:  10/14/2012  Job:  [id number]  cc:   Rollene Rotunda, MD, [facility]

## 2012-10-16 ENCOUNTER — Ambulatory Visit: Payer: PRIVATE HEALTH INSURANCE | Admitting: Internal Medicine

## 2012-10-16 NOTE — Telephone Encounter (Signed)
Left voicemail for [name] that this is okay

## 2012-10-19 ENCOUNTER — Ambulatory Visit: Payer: PRIVATE HEALTH INSURANCE | Admitting: Oncology

## 2012-10-19 ENCOUNTER — Ambulatory Visit (INDEPENDENT_AMBULATORY_CARE_PROVIDER_SITE_OTHER): Payer: PRIVATE HEALTH INSURANCE | Admitting: Internal Medicine

## 2012-10-19 ENCOUNTER — Telehealth: Payer: Self-pay | Admitting: General Practice

## 2012-10-19 ENCOUNTER — Encounter: Payer: Self-pay | Admitting: Internal Medicine

## 2012-10-19 VITALS — BP 100/50 | HR 96 | Temp 97.1°F | Wt 124.8 lb

## 2012-10-19 DIAGNOSIS — E43 Unspecified severe protein-calorie malnutrition: Secondary | ICD-10-CM

## 2012-10-19 DIAGNOSIS — I4891 Unspecified atrial fibrillation: Secondary | ICD-10-CM

## 2012-10-19 DIAGNOSIS — I10 Essential (primary) hypertension: Secondary | ICD-10-CM

## 2012-10-19 DIAGNOSIS — J449 Chronic obstructive pulmonary disease, unspecified: Secondary | ICD-10-CM

## 2012-10-19 DIAGNOSIS — I5032 Chronic diastolic (congestive) heart failure: Secondary | ICD-10-CM

## 2012-10-19 NOTE — Patient Instructions (Addendum)
1. Lungs - seem to be doing OK. Lungs are clear today. Plan Continue your present medications.  2, Heart failure- you have done well. Weight is stable. Plan Follow "rule of 2's:" - weigh every day at the same time in the same clothes  If you can 2 lbs or more in 2 days you will increase your lasix to 80, 40 for two days and call the doctor.  3. Malnutrition - continue the Ensure three times a day or home made milk shakes.   Come back to see in 1 month.
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
## 2012-10-19 NOTE — Telephone Encounter (Signed)
Attempted transitional care call.  Left message on machine.

## 2012-10-22 ENCOUNTER — Other Ambulatory Visit: Payer: Self-pay | Admitting: Internal Medicine

## 2012-10-22 NOTE — Assessment & Plan Note (Signed)
Hospitalized with acute exacerbation with fluid overload. Diuresed 2.8 l and was able to go home.  Today he is doing well.  Plan Adhere to the "rule of 2's" that was reviewed with patient and his primary caretaker.

## 2012-10-22 NOTE — Assessment & Plan Note (Signed)
Stable heart rate at today's exam.

## 2012-10-22 NOTE — Assessment & Plan Note (Signed)
Stable respiratory function - good oxygen saturation on room air.  Plan Continue present medications

## 2012-10-22 NOTE — Assessment & Plan Note (Signed)
Last albumn 3.0, total protein normal.  Plan Eat at least on complete meal per day  Continue on ensure supplement bid-tid

## 2012-10-22 NOTE — Assessment & Plan Note (Signed)
BP Readings from Last 3 Encounters:  10/19/12 100/50  10/14/12 101/33  09/28/12 104/44   Stable on present medications

## 2012-10-26 ENCOUNTER — Ambulatory Visit (HOSPITAL_BASED_OUTPATIENT_CLINIC_OR_DEPARTMENT_OTHER): Payer: PRIVATE HEALTH INSURANCE

## 2012-10-26 ENCOUNTER — Other Ambulatory Visit: Payer: Self-pay | Admitting: Oncology

## 2012-10-26 ENCOUNTER — Other Ambulatory Visit (HOSPITAL_BASED_OUTPATIENT_CLINIC_OR_DEPARTMENT_OTHER): Payer: PRIVATE HEALTH INSURANCE

## 2012-10-26 VITALS — BP 109/45 | HR 77 | Temp 97.3°F

## 2012-10-26 DIAGNOSIS — D638 Anemia in other chronic diseases classified elsewhere: Secondary | ICD-10-CM

## 2012-10-26 DIAGNOSIS — C61 Malignant neoplasm of prostate: Secondary | ICD-10-CM

## 2012-10-26 DIAGNOSIS — N189 Chronic kidney disease, unspecified: Secondary | ICD-10-CM

## 2012-10-26 DIAGNOSIS — D649 Anemia, unspecified: Secondary | ICD-10-CM

## 2012-10-26 LAB — CBC WITH DIFFERENTIAL/PLATELET
BASO%: 0.8 % (ref 0.0–2.0)
Basophils Absolute: 0 10*3/uL (ref 0.0–0.1)
EOS%: 2.6 % (ref 0.0–7.0)
HGB: 10.3 g/dL — ABNORMAL LOW (ref 13.0–17.1)
MCH: 24.5 pg — ABNORMAL LOW (ref 27.2–33.4)
MCHC: 32.2 g/dL (ref 32.0–36.0)
MCV: 76 fL — ABNORMAL LOW (ref 79.3–98.0)
MONO%: 5.8 % (ref 0.0–14.0)
NEUT%: 75.9 % — ABNORMAL HIGH (ref 39.0–75.0)
RDW: 21.6 % — ABNORMAL HIGH (ref 11.0–14.6)
lymph#: 0.9 10*3/uL (ref 0.9–3.3)

## 2012-10-26 MED ORDER — DARBEPOETIN ALFA-POLYSORBATE 300 MCG/0.6ML IJ SOLN
300.0000 ug | Freq: Once | INTRAMUSCULAR | Status: AC
  Administered 2012-10-26: 300 ug via SUBCUTANEOUS
  Filled 2012-10-26: qty 0.6

## 2012-10-30 ENCOUNTER — Emergency Department (HOSPITAL_COMMUNITY): Payer: PRIVATE HEALTH INSURANCE

## 2012-10-30 ENCOUNTER — Encounter (HOSPITAL_COMMUNITY): Payer: Self-pay | Admitting: *Deleted

## 2012-10-30 ENCOUNTER — Inpatient Hospital Stay (HOSPITAL_COMMUNITY)
Admission: EM | Admit: 2012-10-30 | Discharge: 2012-11-02 | DRG: 193 | Disposition: A | Discharge status: Home / Self Care | Payer: PRIVATE HEALTH INSURANCE | Attending: Internal Medicine | Admitting: Internal Medicine

## 2012-10-30 DIAGNOSIS — I4891 Unspecified atrial fibrillation: Secondary | ICD-10-CM | POA: Diagnosis present

## 2012-10-30 DIAGNOSIS — J9600 Acute respiratory failure, unspecified whether with hypoxia or hypercapnia: Secondary | ICD-10-CM | POA: Diagnosis present

## 2012-10-30 DIAGNOSIS — I509 Heart failure, unspecified: Secondary | ICD-10-CM | POA: Diagnosis present

## 2012-10-30 DIAGNOSIS — N400 Enlarged prostate without lower urinary tract symptoms: Secondary | ICD-10-CM | POA: Diagnosis present

## 2012-10-30 DIAGNOSIS — J9601 Acute respiratory failure with hypoxia: Secondary | ICD-10-CM | POA: Diagnosis present

## 2012-10-30 DIAGNOSIS — D638 Anemia in other chronic diseases classified elsewhere: Secondary | ICD-10-CM | POA: Diagnosis present

## 2012-10-30 DIAGNOSIS — Z8546 Personal history of malignant neoplasm of prostate: Secondary | ICD-10-CM

## 2012-10-30 DIAGNOSIS — E43 Unspecified severe protein-calorie malnutrition: Secondary | ICD-10-CM | POA: Diagnosis present

## 2012-10-30 DIAGNOSIS — D649 Anemia, unspecified: Secondary | ICD-10-CM | POA: Diagnosis present

## 2012-10-30 DIAGNOSIS — I5032 Chronic diastolic (congestive) heart failure: Secondary | ICD-10-CM | POA: Diagnosis present

## 2012-10-30 DIAGNOSIS — D696 Thrombocytopenia, unspecified: Secondary | ICD-10-CM | POA: Diagnosis present

## 2012-10-30 DIAGNOSIS — I10 Essential (primary) hypertension: Secondary | ICD-10-CM | POA: Diagnosis present

## 2012-10-30 DIAGNOSIS — I951 Orthostatic hypotension: Secondary | ICD-10-CM

## 2012-10-30 DIAGNOSIS — J189 Pneumonia, unspecified organism: Principal | ICD-10-CM | POA: Diagnosis present

## 2012-10-30 DIAGNOSIS — J441 Chronic obstructive pulmonary disease with (acute) exacerbation: Secondary | ICD-10-CM | POA: Diagnosis present

## 2012-10-30 DIAGNOSIS — IMO0002 Reserved for concepts with insufficient information to code with codable children: Secondary | ICD-10-CM

## 2012-10-30 DIAGNOSIS — Z87891 Personal history of nicotine dependence: Secondary | ICD-10-CM

## 2012-10-30 LAB — URINALYSIS W MICROSCOPIC + REFLEX CULTURE
Nitrite: NEGATIVE
Specific Gravity, Urine: 1.019 (ref 1.005–1.030)
Urobilinogen, UA: 1 mg/dL (ref 0.0–1.0)
pH: 7.5 (ref 5.0–8.0)

## 2012-10-30 LAB — CBC WITH DIFFERENTIAL/PLATELET
Basophils Absolute: 0 10*3/uL (ref 0.0–0.1)
Basophils Relative: 0 % (ref 0–1)
HCT: 31.8 % — ABNORMAL LOW (ref 39.0–52.0)
Lymphocytes Relative: 9 % — ABNORMAL LOW (ref 12–46)
MCHC: 32.1 g/dL (ref 30.0–36.0)
Neutro Abs: 12.2 10*3/uL — ABNORMAL HIGH (ref 1.7–7.7)
Neutrophils Relative %: 85 % — ABNORMAL HIGH (ref 43–77)
RDW: 20.6 % — ABNORMAL HIGH (ref 11.5–15.5)
WBC: 14.5 10*3/uL — ABNORMAL HIGH (ref 4.0–10.5)

## 2012-10-30 LAB — COMPREHENSIVE METABOLIC PANEL
ALT: 7 U/L (ref 0–53)
AST: 14 U/L (ref 0–37)
Albumin: 3 g/dL — ABNORMAL LOW (ref 3.5–5.2)
CO2: 24 mEq/L (ref 19–32)
Calcium: 9.1 mg/dL (ref 8.4–10.5)
Sodium: 136 mEq/L (ref 135–145)
Total Protein: 7.1 g/dL (ref 6.0–8.3)

## 2012-10-30 LAB — BASIC METABOLIC PANEL
BUN: 22 mg/dL (ref 6–23)
Chloride: 102 mEq/L (ref 96–112)
Creatinine, Ser: 1.02 mg/dL (ref 0.50–1.35)
GFR calc Af Amer: 76 mL/min — ABNORMAL LOW (ref >90)
GFR calc non Af Amer: 66 mL/min — ABNORMAL LOW (ref >90)
Potassium: 3.8 mEq/L (ref 3.5–5.1)

## 2012-10-30 LAB — EXPECTORATED SPUTUM ASSESSMENT W GRAM STAIN, RFLX TO RESP C

## 2012-10-30 LAB — TSH: TSH: 1.994 u[IU]/mL (ref 0.350–4.500)

## 2012-10-30 LAB — APTT: aPTT: 38 seconds — ABNORMAL HIGH (ref 24–37)

## 2012-10-30 LAB — PROTIME-INR: Prothrombin Time: 15.9 seconds — ABNORMAL HIGH (ref 11.6–15.2)

## 2012-10-30 LAB — PHOSPHORUS: Phosphorus: 3 mg/dL (ref 2.3–4.6)

## 2012-10-30 LAB — STREP PNEUMONIAE URINARY ANTIGEN: Strep Pneumo Urinary Antigen: NEGATIVE

## 2012-10-30 MED ORDER — ACETAMINOPHEN 650 MG RE SUPP: 650.0000 mg | Freq: Four times a day (QID) | RECTAL | Status: DC | PRN

## 2012-10-30 MED ORDER — FINASTERIDE 5 MG PO TABS
5.0000 mg | ORAL_TABLET | Freq: Every day | ORAL | Status: DC
  Administered 2012-10-30 – 2012-11-02 (×4): 5 mg via ORAL
  Filled 2012-10-30 (×4): qty 1

## 2012-10-30 MED ORDER — ONDANSETRON HCL 4 MG PO TABS: 4.0000 mg | ORAL_TABLET | Freq: Four times a day (QID) | ORAL | Status: DC | PRN

## 2012-10-30 MED ORDER — ALBUTEROL SULFATE (5 MG/ML) 0.5% IN NEBU: 10.0000 mg | INHALATION_SOLUTION | Freq: Once | RESPIRATORY_TRACT | Status: AC

## 2012-10-30 MED ORDER — ALBUTEROL SULFATE (5 MG/ML) 0.5% IN NEBU: 2.5000 mg | INHALATION_SOLUTION | Freq: Four times a day (QID) | RESPIRATORY_TRACT | Status: DC

## 2012-10-30 MED ORDER — IPRATROPIUM BROMIDE 0.02 % IN SOLN
0.5000 mg | Freq: Four times a day (QID) | RESPIRATORY_TRACT | Status: DC
  Administered 2012-10-30: 0.5 mg via RESPIRATORY_TRACT
  Filled 2012-10-30: qty 2.5

## 2012-10-30 MED ORDER — IPRATROPIUM BROMIDE 0.02 % IN SOLN: 0.5000 mg | RESPIRATORY_TRACT | Status: DC | PRN

## 2012-10-30 MED ORDER — SODIUM CHLORIDE 0.9 % IV SOLN: INTRAVENOUS | Status: DC

## 2012-10-30 MED ORDER — SODIUM CHLORIDE 0.9 % IV SOLN
INTRAVENOUS | Status: DC
  Administered 2012-10-30: 10:00:00 via INTRAVENOUS

## 2012-10-30 MED ORDER — HYDROCODONE-ACETAMINOPHEN 5-325 MG PO TABS: 1.0000 | ORAL_TABLET | ORAL | Status: DC | PRN

## 2012-10-30 MED ORDER — METHYLPREDNISOLONE SODIUM SUCC 125 MG IJ SOLR
60.0000 mg | Freq: Two times a day (BID) | INTRAMUSCULAR | Status: DC
  Administered 2012-10-30 – 2012-10-31 (×2): 60 mg via INTRAVENOUS
  Filled 2012-10-30 (×3): qty 0.96

## 2012-10-30 MED ORDER — VITAMIN D3 25 MCG (1000 UNIT) PO TABS
1000.0000 [IU] | ORAL_TABLET | Freq: Every day | ORAL | Status: DC
  Administered 2012-10-30 – 2012-11-02 (×4): 1000 [IU] via ORAL
  Filled 2012-10-30 (×4): qty 1

## 2012-10-30 MED ORDER — IPRATROPIUM BROMIDE 0.02 % IN SOLN
1.0000 mg | Freq: Once | RESPIRATORY_TRACT | Status: AC
Start: 2012-10-30 — End: 2012-10-30
  Administered 2012-10-30: 1 mg via RESPIRATORY_TRACT
  Filled 2012-10-30: qty 2.5

## 2012-10-30 MED ORDER — LEVOFLOXACIN IN D5W 500 MG/100ML IV SOLN
500.0000 mg | INTRAVENOUS | Status: DC
  Administered 2012-10-30 – 2012-10-31 (×2): 500 mg via INTRAVENOUS
  Filled 2012-10-30 (×3): qty 100

## 2012-10-30 MED ORDER — ONDANSETRON HCL 4 MG/2ML IJ SOLN: 4.0000 mg | Freq: Four times a day (QID) | INTRAMUSCULAR | Status: DC | PRN

## 2012-10-30 MED ORDER — IPRATROPIUM BROMIDE 0.02 % IN SOLN
0.5000 mg | Freq: Four times a day (QID) | RESPIRATORY_TRACT | Status: DC
  Administered 2012-10-30 – 2012-11-02 (×9): 0.5 mg via RESPIRATORY_TRACT
  Filled 2012-10-30 (×10): qty 2.5

## 2012-10-30 MED ORDER — LEVOFLOXACIN IN D5W 750 MG/150ML IV SOLN: 750.0000 mg | INTRAVENOUS | Status: DC

## 2012-10-30 MED ORDER — IPRATROPIUM BROMIDE 0.02 % IN SOLN
RESPIRATORY_TRACT | Status: AC
  Filled 2012-10-30: qty 2.5

## 2012-10-30 MED ORDER — SODIUM CHLORIDE 0.9 % IV SOLN
INTRAVENOUS | Status: DC
  Administered 2012-10-30: 23:00:00 via INTRAVENOUS
  Administered 2012-10-31: 75 mL/h via INTRAVENOUS

## 2012-10-30 MED ORDER — TAMSULOSIN HCL 0.4 MG PO CAPS
0.4000 mg | ORAL_CAPSULE | Freq: Every day | ORAL | Status: DC
  Administered 2012-10-30 – 2012-11-02 (×4): 0.4 mg via ORAL
  Filled 2012-10-30 (×4): qty 1

## 2012-10-30 MED ORDER — METHYLPREDNISOLONE SODIUM SUCC 125 MG IJ SOLR
60.0000 mg | Freq: Two times a day (BID) | INTRAMUSCULAR | Status: DC
  Filled 2012-10-30: qty 0.96

## 2012-10-30 MED ORDER — ALBUTEROL (5 MG/ML) CONTINUOUS INHALATION SOLN
INHALATION_SOLUTION | RESPIRATORY_TRACT | Status: AC
  Administered 2012-10-30: 10 mg via RESPIRATORY_TRACT
  Filled 2012-10-30: qty 20

## 2012-10-30 MED ORDER — ADULT MULTIVITAMIN W/MINERALS CH
1.0000 | ORAL_TABLET | Freq: Every day | ORAL | Status: DC
  Administered 2012-10-31 – 2012-11-02 (×3): 1 via ORAL
  Filled 2012-10-30 (×3): qty 1

## 2012-10-30 MED ORDER — ACETAMINOPHEN 325 MG PO TABS: 650.0000 mg | ORAL_TABLET | Freq: Four times a day (QID) | ORAL | Status: DC | PRN

## 2012-10-30 MED ORDER — ASPIRIN EC 81 MG PO TBEC
81.0000 mg | DELAYED_RELEASE_TABLET | Freq: Every day | ORAL | Status: DC
  Administered 2012-10-30 – 2012-11-02 (×4): 81 mg via ORAL
  Filled 2012-10-30 (×4): qty 1

## 2012-10-30 MED ORDER — ALBUTEROL SULFATE (5 MG/ML) 0.5% IN NEBU
2.5000 mg | INHALATION_SOLUTION | Freq: Four times a day (QID) | RESPIRATORY_TRACT | Status: DC
  Administered 2012-10-30 – 2012-11-02 (×11): 2.5 mg via RESPIRATORY_TRACT
  Filled 2012-10-30 (×11): qty 0.5

## 2012-10-30 MED ORDER — ALBUTEROL SULFATE (5 MG/ML) 0.5% IN NEBU: 2.5000 mg | INHALATION_SOLUTION | RESPIRATORY_TRACT | Status: DC | PRN

## 2012-10-30 MED ORDER — SODIUM CHLORIDE 0.9 % IJ SOLN
3.0000 mL | Freq: Two times a day (BID) | INTRAMUSCULAR | Status: DC
  Administered 2012-11-02: 3 mL via INTRAVENOUS

## 2012-10-30 MED ORDER — METHYLPREDNISOLONE SODIUM SUCC 125 MG IJ SOLR
125.0000 mg | Freq: Once | INTRAMUSCULAR | Status: AC
  Administered 2012-10-30: 125 mg via INTRAVENOUS
  Filled 2012-10-30: qty 2

## 2012-10-30 MED ORDER — ENSURE COMPLETE PO LIQD
237.0000 mL | Freq: Three times a day (TID) | ORAL | Status: DC
  Administered 2012-10-30 – 2012-11-02 (×10): 237 mL via ORAL
  Filled 2012-10-30 (×2): qty 237

## 2012-10-30 MED ORDER — ALBUTEROL SULFATE (5 MG/ML) 0.5% IN NEBU
2.5000 mg | INHALATION_SOLUTION | RESPIRATORY_TRACT | Status: DC
  Filled 2012-10-30: qty 0.5

## 2012-10-30 NOTE — Progress Notes (Signed)
UR completed 

## 2012-10-30 NOTE — ED Notes (Signed)
[id number]<AV> Expected date:<BR> Expected time:<BR> Means of arrival:<BR> Comments:<BR> ems- elderly, not feeling well, SOB

## 2012-10-30 NOTE — Progress Notes (Signed)
Patient declined Engineer, structural
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
## 2012-10-30 NOTE — ED Notes (Signed)
Per EMS pt called them to his home c/o SOB and generalized weakness. His vitals on arrival by EMS 102/50, hr 80, 100%ra. Pt is alert and oriented, no c/o pain. EMS gave him an albuterol treatment before arrival to the ED.

## 2012-10-30 NOTE — Progress Notes (Signed)
ANTIBIOTIC CONSULT NOTE - INITIAL  Pharmacy Consult for:  May adjust antibiotic dosages for renal function Indication: AECOPD  No Known Allergies  Patient Measurements:   From 10/19/12:  Wt 56.6 kg From 10/13/12:  Ht 170 cm  Vital Signs: Temp: 98.6 F (37 C) (08/11 0913) Temp src: Oral (08/11 0913) BP: 111/43 mmHg (08/11 1142) Pulse Rate: 107 (08/11 1142) Intake/Output from previous day:   Intake/Output from this shift:    Labs:  Recent Labs  10/30/12 0940  WBC 14.5*  HGB 10.2*  PLT 120*  CREATININE 1.02   Estimated CrCl approximately 45 mL/min   The CrCl is unknown because both a height and weight (above a minimum accepted value) are required for this calculation. No results found for this basename: VANCOTROUGH, VANCOPEAK, VANCORANDOM, GENTTROUGH, GENTPEAK, GENTRANDOM, TOBRATROUGH, TOBRAPEAK, TOBRARND, AMIKACINPEAK, AMIKACINTROU, AMIKACIN,  in the last 72 hours   Microbiology: No results found for this or any previous visit (from the past 720 hour(s)).  Medical History: Past Medical History  Diagnosis Date  . Atrial fibrillation   . Diagnosis unknown     lytic lesions-spine-biopsy negative?  . Prostate cancer   . Tricuspid regurgitation     pulmonary hypertension and mitral regurgitation  . Anemia, iron deficiency     f/u by hematology  . Measles   . Chronic obstructive pulmonary emphysema   . Fibrous dysplasia of bone     Bone scan Sept '11 - no mets; skeletal survey Sept '11  . Chronic diastolic CHF (congestive heart failure)     a. Echo 2/12: Mild LVH, EF 55%, mild MR, mild BAE, moderate TR, PASP 56, small effusion  . MGUS (monoclonal gammopathy of unknown significance)     Medications:  Scheduled:  . albuterol  2.5 mg Nebulization Q4H  . albuterol  2.5 mg Nebulization Q6H  . aspirin EC  81 mg Oral Daily  . cholecalciferol  1,000 Units Oral Daily  . feeding supplement  237 mL Oral TID BM  . finasteride  5 mg Oral Daily  . ipratropium      .  ipratropium  0.5 mg Nebulization Q6H  . methylPREDNISolone (SOLU-MEDROL) injection  60 mg Intravenous Q12H  . sodium chloride  3 mL Intravenous Q12H  . tamsulosin  0.4 mg Oral Daily   Infusions:  . sodium chloride    . levofloxacin (LEVAQUIN) IV     PRN: acetaminophen, acetaminophen, albuterol, HYDROcodone-acetaminophen, ipratropium, ondansetron (ZOFRAN) IV, ondansetron   Assessment: 79 y/o M with acute COPD exacerbation, beginning empiric Levaquin. Initial order was for 750 mg IV q24h.  Given CrCl < 89mL/min will use a lower dosage.  Goal:  Adjust antibiotic dosage for renal function  Plan:  Reduce Levaquin to 500 mg IV q24h Follow serum creatinine Follow cultures   Elie Goody, PharmD, BCPS Pager: [phone] 10/30/2012  12:23 PM
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
## 2012-10-30 NOTE — Evaluation (Signed)
Physical Therapy Evaluation Patient Details Name: TYR FRANCA MRN: [id number] DOB: 1926-11-02 Today's Date: 10/30/2012 Time: 4098-1191 PT Time Calculation (min): 15 min  PT Assessment / Plan / Recommendation History of Present Illness  79 yo male admitted with resp failure. Hx of COPD. Recent admit ~2 weeks ago.   Clinical Impression  On eval, pt required Supervision level assist for mobility-able to ambulate ~275' with 4 wheeled walker. O2 sats 95% on RA immediately after ambulation. Dyspnea 2/4. Pt tolerated activity well. Do not anticipate any follow up PT needs at discharge. Will follow.     PT Assessment  Patient needs continued PT services    Follow Up Recommendations  No PT follow up    Does the patient have the potential to tolerate intense rehabilitation      Barriers to Discharge        Equipment Recommendations  None recommended by PT    Recommendations for Other Services OT consult   Frequency Min 3X/week    Precautions / Restrictions Precautions Precautions: Fall Restrictions Weight Bearing Restrictions: No   Pertinent Vitals/Pain No c/o pain. 95% RA after ambulation/end of session      Mobility  Bed Mobility Bed Mobility: Supine to Sit;Sit to Supine Supine to Sit: 6: Modified independent (Device/Increase time);HOB elevated Sit to Supine: 6: Modified independent (Device/Increase time);HOB elevated Transfers Transfers: Sit to Stand;Stand to Sit Sit to Stand: 6: Modified independent (Device/Increase time);From bed Stand to Sit: 6: Modified independent (Device/Increase time);To bed Ambulation/Gait Ambulation/Gait Assistance: 5: Supervision Ambulation Distance (Feet): 275 Feet Assistive device: 4-wheeled walker Ambulation/Gait Assistance Details: Pt requested use of walker instead of cane. Has both at home Gait Pattern: Step-through pattern;Decreased stride length;Trunk flexed    Exercises     PT Diagnosis: Difficulty walking  PT Problem List:  Decreased mobility;Decreased activity tolerance PT Treatment Interventions: DME instruction;Gait training;Functional mobility training;Therapeutic activities;Therapeutic exercise;Patient/family education     PT Goals(Current goals can be found in the care plan section) Acute Rehab PT Goals Patient Stated Goal: home PT Goal Formulation: With patient Time For Goal Achievement: 11/06/12 Potential to Achieve Goals: Good  Visit Information  Last PT Received On: 10/30/12 Assistance Needed: +1 History of Present Illness: 79 yo male admitted with resp failure. Hx of COPD. Recent admit ~2 weeks ago.        Prior Functioning  Home Living Family/patient expects to be discharged to:: Private residence Living Arrangements: Alone Type of Home: Apartment Home Access: Level entry;Elevator Home Layout: One level Home Equipment: Walker - 4 wheels;Cane - single point Prior Function Level of Independence: Independent Communication Communication: No difficulties    Cognition  Cognition Arousal/Alertness: Awake/alert Behavior During Therapy: WFL for tasks assessed/performed Overall Cognitive Status: Within Functional Limits for tasks assessed    Extremity/Trunk Assessment Lower Extremity Assessment Lower Extremity Assessment: Overall WFL for tasks assessed Cervical / Trunk Assessment Cervical / Trunk Assessment: Kyphotic   Balance    End of Session PT - End of Session Activity Tolerance: Patient tolerated treatment well Patient left: in bed;with call bell/phone within reach  GP Functional Assessment Tool Used: clinical judgement Functional Limitation: Mobility: Walking and moving around Mobility: Walking and Moving Around Current Status (Y7829): At least 1 percent but less than 20 percent impaired, limited or restricted Mobility: Walking and Moving Around Goal Status [id number]): 0 percent impaired, limited or restricted   Rebeca Alert, MPT Pager: [phone]

## 2012-10-31 DIAGNOSIS — J441 Chronic obstructive pulmonary disease with (acute) exacerbation: Secondary | ICD-10-CM

## 2012-10-31 DIAGNOSIS — I4891 Unspecified atrial fibrillation: Secondary | ICD-10-CM

## 2012-10-31 DIAGNOSIS — I5032 Chronic diastolic (congestive) heart failure: Secondary | ICD-10-CM

## 2012-10-31 DIAGNOSIS — E43 Unspecified severe protein-calorie malnutrition: Secondary | ICD-10-CM

## 2012-10-31 DIAGNOSIS — J189 Pneumonia, unspecified organism: Principal | ICD-10-CM

## 2012-10-31 LAB — COMPREHENSIVE METABOLIC PANEL
ALT: 6 U/L (ref 0–53)
AST: 10 U/L (ref 0–37)
Albumin: 2.9 g/dL — ABNORMAL LOW (ref 3.5–5.2)
Alkaline Phosphatase: 49 U/L (ref 39–117)
BUN: 22 mg/dL (ref 6–23)
Chloride: 102 mEq/L (ref 96–112)
Potassium: 4.2 mEq/L (ref 3.5–5.1)
Sodium: 136 mEq/L (ref 135–145)
Total Bilirubin: 0.3 mg/dL (ref 0.3–1.2)
Total Protein: 7 g/dL (ref 6.0–8.3)

## 2012-10-31 LAB — CBC
HCT: 32.6 % — ABNORMAL LOW (ref 39.0–52.0)
MCH: 23.5 pg — ABNORMAL LOW (ref 26.0–34.0)
MCHC: 30.7 g/dL (ref 30.0–36.0)
MCV: 76.7 fL — ABNORMAL LOW (ref 78.0–100.0)
Platelets: 141 10*3/uL — ABNORMAL LOW (ref 150–400)
RDW: 20.7 % — ABNORMAL HIGH (ref 11.5–15.5)

## 2012-10-31 LAB — URINE CULTURE

## 2012-10-31 LAB — LEGIONELLA ANTIGEN, URINE

## 2012-10-31 MED ORDER — FUROSEMIDE 40 MG PO TABS
40.0000 mg | ORAL_TABLET | Freq: Two times a day (BID) | ORAL | Status: DC
  Administered 2012-10-31 – 2012-11-01 (×2): 40 mg via ORAL
  Filled 2012-10-31 (×4): qty 1

## 2012-10-31 MED ORDER — LEVOFLOXACIN 500 MG PO TABS
500.0000 mg | ORAL_TABLET | Freq: Every day | ORAL | Status: DC
  Administered 2012-11-01 – 2012-11-02 (×2): 500 mg via ORAL
  Filled 2012-10-31 (×2): qty 1

## 2012-10-31 MED ORDER — PREDNISONE 20 MG PO TABS
30.0000 mg | ORAL_TABLET | Freq: Every day | ORAL | Status: DC
  Administered 2012-11-01 – 2012-11-02 (×2): 30 mg via ORAL
  Filled 2012-10-31 (×3): qty 1

## 2012-10-31 MED ORDER — DILTIAZEM HCL ER COATED BEADS 120 MG PO CP24
120.0000 mg | ORAL_CAPSULE | Freq: Every day | ORAL | Status: DC
  Administered 2012-10-31 – 2012-11-02 (×3): 120 mg via ORAL
  Filled 2012-10-31 (×3): qty 1

## 2012-10-31 NOTE — Progress Notes (Signed)
Spoke with pt's grandson Hessie Diener [phone], concerning Home Health needs. Pt would benefit with HHRN for medication teaching, diet, assessment, observation, HHNA.

## 2012-10-31 NOTE — Progress Notes (Signed)
Subjective: [name] admitted with a bronchitic exacerbation of his COPD, presenting with hypoxemia and increased work of breathing. In addition, he had a minor bump in his BNP and may have been a little wet.   He is sitting on the side of the bed in no distress. Reports he feels much better  Objective: Lab:  Recent Labs  10/30/12 0940 10/31/12 0403  WBC 14.5* 9.4  NEUTROABS 12.2*  --   HGB 10.2* 10.0*  HCT 31.8* 32.6*  MCV 76.6* 76.7*  PLT 120* 141*    Recent Labs  10/30/12 0940 10/30/12 1245 10/31/12 0403  NA 138 136 136  K 3.8 3.5 4.2  CL 102 103 102  GLUCOSE 91 112* 200*  BUN 22 22 22   CREATININE 1.02 0.96 0.78  CALCIUM 8.8 9.1 8.9  MG  --  2.1  --   PHOS  --  3.0  --     Imaging:  Scheduled Meds: . albuterol  2.5 mg Nebulization Q6H  . aspirin EC  81 mg Oral Daily  . cholecalciferol  1,000 Units Oral Daily  . feeding supplement  237 mL Oral TID BM  . finasteride  5 mg Oral Daily  . ipratropium  0.5 mg Nebulization QID  . levofloxacin (LEVAQUIN) IV  500 mg Intravenous Q24H  . methylPREDNISolone (SOLU-MEDROL) injection  60 mg Intravenous Q12H  . multivitamin with minerals  1 tablet Oral Daily  . sodium chloride  3 mL Intravenous Q12H  . tamsulosin  0.4 mg Oral Daily   Continuous Infusions: . sodium chloride 75 mL/hr (10/31/12 1202)   PRN Meds:.acetaminophen, acetaminophen, albuterol, HYDROcodone-acetaminophen, ipratropium, ondansetron (ZOFRAN) IV, ondansetron   Physical Exam: Filed Vitals:   10/31/12 0443  BP: 125/54  Pulse: 97  Temp: 98.3 F (36.8 C)  Resp: 19    Intake/Output Summary (Last 24 hours) at 10/31/12 1244 Last data filed at 10/31/12 4540  Gross per 24 hour  Intake 1646.25 ml  Output   1200 ml  Net 446.25 ml   Very thin AA man in no distress Cor- 2+ radial, RRR Pulm - moving air well, no rales or wheezes Neuro - alert and oriented       Assessment/Plan: 1. Pulm/ID - patient with bronchitic exacerbation. WBC down.  Breathing improved. Plan Change levaquin to PO  Change prednisone to 30 mg bid  2. CHF - no signs of active failure Plan  IV to KVO  PO furosemide  BNP in AM   3. A fib - heart rate regular on exam Plan Resume diltiazem    [name] IM (o) [phone]; (c) [phone] Call-grp - Patsi Sears IM  Tele: [phone]  10/31/2012, 12:43 PM

## 2012-10-31 NOTE — Progress Notes (Signed)
Patient complained of having sudden  shortness of breath, O2 Sats checked 100% roomair, RR 24, denies having anxiety at this time.

## 2012-10-31 NOTE — Progress Notes (Signed)
Received referral from COPD GOLD program for [facility] Care Management services. Spoke with patient at bedside who asked that this writer call his Jenesis, Suchy [phone] to explain services. Grandson reports patient could benefit from [facility] Care Management services. Patient lives alone in independent living apartment, [place]. Grandson and daughter are supportive. Consents signed. [facility] Care Management will not replace home health. Confirmed best contact information and address. Will follow up with hospital discharge phone call and will be evaluated for monthly home visits. Left [facility] Care Management packet at bedside.  Raiford Noble, MSN-Ed, RN,BSN- [facility] [phone]

## 2012-10-31 NOTE — Evaluation (Signed)
Occupational Therapy Evaluation Patient Details Name: GEARY RUFO MRN: [id number] DOB: [birth year] Today's Date: 10/31/2012 Time: 1040-1107 OT Time Calculation (min): 27 min  OT Assessment / Plan / Recommendation History of present illness 79 yo male admitted with resp failure. Hx of COPD. Recent admit ~2 weeks ago.    Clinical Impression   Pt is 79 y/o male whom was admitted w/ ARF. He plans to d/c home to senior apartment alone w/ family PRN assist. He demonstrates deficits in endurance and should benefit from acute OT for pt/family education & engery conservation tech's. He reports that his family is "looking into getting someone to help me with showers and around the house" ? Home Health Aide.     OT Assessment  Patient needs continued OT Services    Follow Up Recommendations  No OT follow up    Barriers to Discharge Other (comment) (Lives alone)    Equipment Recommendations  None recommended by OT    Recommendations for Other Services    Frequency  Min 2X/week    Precautions / Restrictions Precautions Precautions: Fall Restrictions Weight Bearing Restrictions: No   Pertinent Vitals/Pain Denies pain, no c/o    ADL  Eating/Feeding: Simulated;Independent Where Assessed - Eating/Feeding: Edge of bed Grooming: Performed;Modified independent Where Assessed - Grooming: Unsupported standing Upper Body Bathing: Simulated;Set up Where Assessed - Upper Body Bathing: Unsupported sitting (EOB) Lower Body Bathing: Simulated;Set up Where Assessed - Lower Body Bathing: Supported sit to stand Upper Body Dressing: Simulated;Set up Where Assessed - Upper Body Dressing: Unsupported sitting Lower Body Dressing: Performed;Modified independent (Donned socks sitting EOB) Where Assessed - Lower Body Dressing: Unsupported sit to stand Toilet Transfer: Performed;Supervision/safety Toilet Transfer Method: Sit to Barista: Raised toilet seat with arms (or 3-in-1  over toilet) Toileting - Clothing Manipulation and Hygiene: Performed;Supervision/safety Where Assessed - Glass blower/designer Manipulation and Hygiene: Standing Tub/Shower Transfer Method: Not assessed Equipment Used: Gait belt Transfers/Ambulation Related to ADLs: Pt is overall mod I- supervision level transfers, he reports SOB has improved, however requires rest break after ambulating to bathroom transferring onto commode (3:1 over toilet). ADL Comments: Pt is 79 y/o male whom was admitted w/ ARF. He plans to d/c home to senior apartment alone w/ family PRN assist. He demonstrates deficits in endurance and should benefit from acute OT for pt/family education & engery conservation tech's. He reports that his family is "looking into getting someone to help me with showers and around the house" ? Home Health Aide.    OT Diagnosis: Generalized weakness  OT Problem List: Decreased activity tolerance;Decreased knowledge of use of DME or AE;Cardiopulmonary status limiting activity OT Treatment Interventions: Self-care/ADL training;Energy conservation;DME and/or AE instruction;Patient/family education;Therapeutic activities   OT Goals(Current goals can be found in the care plan section) Acute Rehab OT Goals Patient Stated Goal: Home with someone to maybe help me out OT Goal Formulation: With patient Time For Goal Achievement: 11/07/12 Potential to Achieve Goals: Good ADL Goals Pt Will Perform Grooming: with modified independence;sitting;standing Pt Will Transfer to Toilet: with modified independence;ambulating Pt Will Perform Toileting - Clothing Manipulation and hygiene: with modified independence;sit to/from stand Pt Will Perform Tub/Shower Transfer: with supervision;ambulating;shower seat;grab bars;rolling walker Additional ADL Goal #1: Pt will state 2-3 energy conservation tech's that he can implement during ADL's  Visit Information  Last OT Received On: 10/31/12 History of Present Illness:  79 yo male admitted with resp failure. Hx of COPD. Recent admit ~2 weeks ago.  Prior Functioning     Home Living Family/patient expects to be discharged to:: Private residence Living Arrangements: Alone Available Help at Discharge: Available PRN/intermittently Type of Home: Apartment Home Access: Level entry;Elevator Home Layout: One level Home Equipment: Walker - 4 wheels;Cane - single point;Shower seat;Grab bars - tub/shower Additional Comments: pull cord in bedroom and bathroom Prior Function Level of Independence: Needs assistance ADL's / Homemaking Assistance Needed: Pt reports that duaghter/grandson do his laundry & "they are looking into someone hleping me at home & in the shower" ?Hom Health Aide Comments: Pt drives Communication Communication: No difficulties Dominant Hand: Right    Vision/Perception Vision - History Baseline Vision: Other (comment) (Wears for reading and driving) Patient Visual Report: No change from baseline   Cognition  Cognition Arousal/Alertness: Awake/alert Behavior During Therapy: WFL for tasks assessed/performed Overall Cognitive Status: Within Functional Limits for tasks assessed    Extremity/Trunk Assessment Upper Extremity Assessment Upper Extremity Assessment: Overall WFL for tasks assessed Lower Extremity Assessment Lower Extremity Assessment: Overall WFL for tasks assessed Cervical / Trunk Assessment Cervical / Trunk Assessment: Kyphotic     Mobility Bed Mobility Bed Mobility: Sitting - Scoot to Edge of Bed Sitting - Scoot to Edge of Bed: 7: Independent Transfers Transfers: Sit to Stand;Stand to [employer] to Stand: 6: Modified independent (Device/Increase time);From bed;From chair/3-in-1;Other (comment) (3:1 over toilet in bathroom) Stand to Sit: 6: Modified independent (Device/Increase time);To bed;To chair/3-in-1;Other (comment) (3:1 over toilet in bathroom) Details for Transfer Assistance: Pt was independent in taking  rest break x1 after functional mobility to bathroom.        Balance Balance Balance Assessed: Yes Static Sitting Balance Static Sitting - Balance Support: No upper extremity supported;Feet supported Dynamic Sitting Balance Dynamic Sitting - Balance Support: No upper extremity supported;During functional activity Static Standing Balance Static Standing - Balance Support: No upper extremity supported;During functional activity   End of Session OT - End of Session Equipment Utilized During Treatment: Gait belt;Other (comment) (3:1 over toilet in bathroom) Activity Tolerance: Patient tolerated treatment well Patient left: in bed;with call bell/phone within reach (Pt sitting EOB prior to OT & after OT session)  GO     Alm Bustard 10/31/2012, 11:27 AM

## 2012-11-01 ENCOUNTER — Inpatient Hospital Stay (HOSPITAL_COMMUNITY): Payer: PRIVATE HEALTH INSURANCE

## 2012-11-01 LAB — CULTURE, RESPIRATORY W GRAM STAIN

## 2012-11-01 LAB — BASIC METABOLIC PANEL
BUN: 29 mg/dL — ABNORMAL HIGH (ref 6–23)
CO2: 27 mEq/L (ref 19–32)
Calcium: 9.2 mg/dL (ref 8.4–10.5)
GFR calc non Af Amer: 83 mL/min — ABNORMAL LOW (ref >90)
Glucose, Bld: 134 mg/dL — ABNORMAL HIGH (ref 70–99)

## 2012-11-01 MED ORDER — FUROSEMIDE 10 MG/ML IJ SOLN
40.0000 mg | Freq: Four times a day (QID) | INTRAMUSCULAR | Status: DC
  Administered 2012-11-01 – 2012-11-02 (×3): 40 mg via INTRAVENOUS
  Filled 2012-11-01 (×6): qty 4

## 2012-11-01 NOTE — Progress Notes (Signed)
Physical Therapy Treatment Patient Details Name: [name] MRN: [id number] DOB: 1926/04/20 Today's Date: 11/01/2012 Time: 0865-7846 PT Time Calculation (min): 27 min  PT Assessment / Plan / Recommendation  History of Present Illness 79 yo male admitted with resp failure. Hx of COPD. Recent admit ~2 weeks ago.    PT Comments   Pt tolerated well today.   Follow Up Recommendations   (may need HH to follow for pulmonary therapy/)     Does the patient have the potential to tolerate intense rehabilitation     Barriers to Discharge        Equipment Recommendations       Recommendations for Other Services    Frequency Min 3X/week   Progress towards PT Goals    Plan Current plan remains appropriate    Precautions / Restrictions Precautions Precautions: Fall   Pertinent Vitals/Pain On RA with session at 94-97% , but 2/4 dyspnea after ambulation     Mobility  Bed Mobility Bed Mobility: Sitting - Scoot to Edge of Bed Supine to Sit: 6: Modified independent (Device/Increase time);HOB elevated Sitting - Scoot to Edge of Bed: 7: Independent Sit to Supine: 6: Modified independent (Device/Increase time);With rail;HOB elevated Transfers Transfers: Sit to Stand;Stand to Sit Sit to Stand: 5: Supervision;With upper extremity assist;From bed Stand to Sit: With upper extremity assist;To bed;6: Modified independent (Device/Increase time) Details for Transfer Assistance: pt fairly steady on feet for transfer and static standing at  bedside for activities.  Ambulation/Gait Ambulation/Gait Assistance: 5: Supervision Ambulation Distance (Feet): 140 Feet Assistive device: 4-wheeled walker Ambulation/Gait Assistance Details: slow and steady Gait Pattern: Step-through pattern;Decreased stride length;Trunk flexed General Gait Details: ambulated on RA at 94-97% after ambulation , however dyspnea at 2/4 and using accessory muscles for about 5 minutes for recovery.  Stairs: No    Exercises      PT Diagnosis:    PT Problem List:   PT Treatment Interventions:     PT Goals (current goals can now be found in the care plan section) Acute Rehab PT Goals Patient Stated Goal: Home with someone to maybe help me out  Visit Information  Last PT Received On: 11/01/12 Assistance Needed: +1 History of Present Illness: 79 yo male admitted with resp failure. Hx of COPD. Recent admit ~2 weeks ago.     Subjective Data  Subjective:     That walk didn't seemas good as the other day.  Patient Stated Goal: Home with someone to maybe help me out   Cognition  Cognition Arousal/Alertness: Awake/alert Behavior During Therapy: WFL for tasks assessed/performed Overall Cognitive Status: Within Functional Limits for tasks assessed    Balance     End of Session PT - End of Session Activity Tolerance: Patient tolerated treatment well Patient left: in bed;with call bell/phone within reach Nurse Communication: Mobility status   GP     Marella Bile 11/01/2012, 4:53 PM Marella Bile, PT Pager: [phone] 11/01/2012

## 2012-11-01 NOTE — Progress Notes (Signed)
Subjective: Mild SOB and wet cough. No increased work of breathing  Objective: Lab:  Recent Labs  10/30/12 0940 10/31/12 0403  WBC 14.5* 9.4  NEUTROABS 12.2*  --   HGB 10.2* 10.0*  HCT 31.8* 32.6*  MCV 76.6* 76.7*  PLT 120* 141*    Recent Labs  10/30/12 1245 10/31/12 0403 11/01/12 0445  NA 136 136 138  K 3.5 4.2 4.5  CL 103 102 103  GLUCOSE 112* 200* 134*  BUN 22 22 29*  CREATININE 0.96 0.78 0.74  CALCIUM 9.1 8.9 9.2  MG 2.1  --   --   PHOS 3.0  --   --    BNP 1610  Imaging:  Scheduled Meds: . albuterol  2.5 mg Nebulization Q6H  . aspirin EC  81 mg Oral Daily  . cholecalciferol  1,000 Units Oral Daily  . diltiazem  120 mg Oral Daily  . feeding supplement  237 mL Oral TID BM  . finasteride  5 mg Oral Daily  . furosemide  40 mg Oral BID  . ipratropium  0.5 mg Nebulization QID  . levofloxacin  500 mg Oral Daily  . multivitamin with minerals  1 tablet Oral Daily  . predniSONE  30 mg Oral Q breakfast  . sodium chloride  3 mL Intravenous Q12H  . tamsulosin  0.4 mg Oral Daily   Continuous Infusions: . sodium chloride 20 mL/hr (10/31/12 1255)   PRN Meds:.acetaminophen, acetaminophen, albuterol, HYDROcodone-acetaminophen, ipratropium, ondansetron (ZOFRAN) IV, ondansetron   Physical Exam: Filed Vitals:   11/01/12 0511  BP: 114/63  Pulse: 72  Temp: 97.5 F (36.4 C)  Resp: 18   Very tin AA man  Cor - Slow rate controlled A. Fib.  Pulm - decrease BS left base, no wheezing or rales. Neuro - normal.       Assessment/Plan: 1. Pulm/ID - increased SOB. No cough Day #3 levaquin Plan  Continue po levaquin  Continue prednisone 30 mg bid  2. CHF - increase BNP, decreased BS and increased SOB Plan Change furosemide to IV  Daily weights and I/O  2 view CXR  3. A. Fib - controlled rate.   Illene Regulus [place] IM (o) [phone]; (c) [phone] Call-grp - Patsi Sears IM  Tele: [phone]  11/01/2012, 12:30 PM

## 2012-11-02 DIAGNOSIS — I10 Essential (primary) hypertension: Secondary | ICD-10-CM

## 2012-11-02 LAB — BASIC METABOLIC PANEL
BUN: 33 mg/dL — ABNORMAL HIGH (ref 6–23)
CO2: 32 mEq/L (ref 19–32)
Calcium: 9.4 mg/dL (ref 8.4–10.5)
GFR calc non Af Amer: 81 mL/min — ABNORMAL LOW (ref >90)
Glucose, Bld: 109 mg/dL — ABNORMAL HIGH (ref 70–99)
Sodium: 137 mEq/L (ref 135–145)

## 2012-11-02 MED ORDER — PREDNISONE 10 MG PO TABS: 30.0000 mg | ORAL_TABLET | ORAL | Status: DC

## 2012-11-02 MED ORDER — LEVOFLOXACIN 500 MG PO TABS: 500.0000 mg | ORAL_TABLET | Freq: Every day | ORAL | Status: DC

## 2012-11-02 NOTE — Progress Notes (Signed)
Subjective: Sitting on side of bed - jsut had lunch. No complaints. Feels better  Objective: Lab:  Recent Labs  10/31/12 0403  WBC 9.4  HGB 10.0*  HCT 32.6*  MCV 76.7*  PLT 141*    Recent Labs  10/30/12 1245 10/31/12 0403 11/01/12 0445 11/02/12 0448  NA 136 136 138 137  K 3.5 4.2 4.5 4.1  CL 103 102 103 99  GLUCOSE 112* 200* 134* 109*  BUN 22 22 29* 33*  CREATININE 0.96 0.78 0.74 0.78  CALCIUM 9.1 8.9 9.2 9.4  MG 2.1  --   --   --   PHOS 3.0  --   --   --    BNP 8/13  1610  Imaging: CXR 8/13: Comparison: October 30, 2012.  Findings: Stable cardiomediastinal silhouette. Right rib deformity  is again noted and unchanged. Right lung is clear. No  pneumothorax or pleural effusion is noted. Minimal left basilar  opacity is noted consistent with subsegmental atelectasis or  pneumonia.  IMPRESSION:  Minimal left basilar opacity as described above.   Scheduled Meds: . albuterol  2.5 mg Nebulization Q6H  . aspirin EC  81 mg Oral Daily  . cholecalciferol  1,000 Units Oral Daily  . diltiazem  120 mg Oral Daily  . feeding supplement  237 mL Oral TID BM  . finasteride  5 mg Oral Daily  . furosemide  40 mg Intravenous Q6H  . ipratropium  0.5 mg Nebulization QID  . levofloxacin  500 mg Oral Daily  . multivitamin with minerals  1 tablet Oral Daily  . predniSONE  30 mg Oral Q breakfast  . sodium chloride  3 mL Intravenous Q12H  . tamsulosin  0.4 mg Oral Daily   Continuous Infusions: . sodium chloride 20 mL/hr (10/31/12 1255)   PRN Meds:.acetaminophen, acetaminophen, albuterol, HYDROcodone-acetaminophen, ipratropium, ondansetron (ZOFRAN) IV, ondansetron   Physical Exam: Filed Vitals:   11/02/12 0520  BP: 116/44  Pulse: 69  Temp: 97.7 F (36.5 C)  Resp: 20   Total I/O this adm: -2,718  Lungs - no increased WOB, no rales or wheezes Cor - heart sounds distant. Regular rate vs slow a. Fib.    Assessment/Plan: 1. Pul/ID - seems stable on oral prednisone.   Plan check room air o2 sat in preparation for d/c  2. CHF - CXR w/o pul edema. Good diuresis Plan Continue regular meds - revert to po lasix  3. A. Fib -  Stable  dispo - will return this PM to do discharge.    Illene Regulus [place] IM (o) [phone]; (c) [phone] Call-grp - Patsi Sears IM  Tele: [phone]  11/02/2012, 12:37 PM

## 2012-11-02 NOTE — Progress Notes (Signed)
PM note: Room air O2 sats are good. For d/c home this PM  Dictated # [phone]

## 2012-11-02 NOTE — Progress Notes (Signed)
CSW received consult for COPD Gold Protocol - CSW administered Depression Scale (patient scored 1/27) & Generalized Anxiety Disorder scale (patient scored 1/21). Screening forms placed on shadow chart. Patient states he plans to return home at discharge.  No further CSW needs identified.   Unice Bailey, LCSW [facility] Clinical Social Worker cell #: [phone]

## 2012-11-02 NOTE — Progress Notes (Signed)
SATURATION QUALIFICATIONS: (This note is used to comply with regulatory documentation for home oxygen)  Patient Saturations on Room Air at Rest = 96%  Patient Saturations on Room Air while Ambulating = 92%  Patient Saturations on 0 Liters of oxygen while Ambulating = 92-96%  Please briefly explain why patient needs home oxygen:

## 2012-11-03 NOTE — Discharge Summary (Signed)
[name], [name] NO.:  [id number]  MEDICAL RECORD NO.:  [id number]  LOCATION:  1422                         FACILITY:  [facility]  PHYSICIAN:  [name]. Marland Reine, MD  DATE OF BIRTH:  08/01/1926  DATE OF ADMISSION:  10/30/2012 DATE OF DISCHARGE:  11/02/2012                              DISCHARGE SUMMARY   ADMITTING DIAGNOSES: 1. Acute respiratory failure with hypoxemia secondary to chronic     obstructive pulmonary disease exacerbation and community-acquired     pneumonia. 2. Atrial fibrillation with stable rate control. 3. History of chronic diastolic heart failure. 4. History of chronic anemia. 5. Stable thrombocytopenia. 6. Hypertension. 7. Benign prostatic hypertrophy. 8. Protein-calorie malnutrition.  DISCHARGE DIAGNOSES: 1. Acute respiratory failure with hypoxemia secondary to chronic     obstructive pulmonary disease exacerbation and community-acquired     pneumonia. 2. Atrial fibrillation with stable rate control. 3. History of chronic diastolic heart failure. 4. History of chronic anemia. 5. Stable thrombocytopenia. 6. Hypertension. 7. Benign prostatic hypertrophy. 8. Protein-calorie malnutrition.  CONSULTANTS:  None.  PROCEDURES/IMAGING: 1. Chest x-ray, October 30, 2012, with no acute cardiopulmonary     abnormality seen.  Left mid lung nodular opacity seen on prior exam     is not currently well visualized. 2. Chest x-ray, 2 view, November 01, 2012, with minimal left basilar     opacity as described above.  HISTORY OF PRESENT ILLNESS:  [name] is an 79 year old gentleman with a past medical history of advanced COPD, BPH, diastolic heart failure with last 2D echo in July 2014, with normal EF, and BPH, who presented to [facility] Emergency Department, complaining of increased shortness of breath for the 24 hours prior to admission with associated wheezing.  The patient denies fevers or chills, cough or chest pain or palpitations.  He  tried using his Advair and albuterol inhaler at home, but had no significant improvement.  In the emergency department, the patient was given nebulizer treatment, Solu-Medrol IV, and this improved his condition, although he continued to wheeze.  Vital signs were stable.  CBC revealed a mild leukocytosis of 14,500.  Chest x-ray revealed no acute pulmonary process.  The patient was subsequently admitted for management of his acute respiratory distress.  Please see the H and P for past medical history, family history, and social history.  HOSPITAL COURSE: 1. Pulmonary/ID:  The patient was started on IV antibiotics and this     was converted to p.o. antibiotics.  Evidence for acute community-     acquired pneumonia were somewhat lacking and that he had no fever,     no productive cough.  Minimal changes on x-ray.  The patient's     wheezing did become well controlled, moving air well.  The     patient's oxygen saturations were checked on the day of discharge     on room air when he was actually gets saturating in the 96% range     at rest and 92% range with ambulation.  At this point, the patient     is stable and ready for discharge home.  He will continue on a     prednisone burst and taper.  He will complete a course of     antibiotics for his community-acquired pneumonia. 2. Congestive heart failure:  The patient had no pulmonary edema on x-     ray.  He was given Lasix IV and did have a good diuresis with a     total output this admission at 2718 mL.  He did feel much better in     regard to respiratory function.  Plan, the patient will be     discharged home.  I will continue his home medications including     p.o. Lasix. 3. Atrial fibrillation:  The patient has been holding stable rate.  On     auscultation, he was found to be in sinus rhythm versus very good     rate control. 4. Calorie-protein malnutrition.  The patient does have some mild     weight loss, but not much over the  last couple of months, but he is     very thin.  He will continue with oral supplements at home.  With the patient's respiratory status being markedly improved with other problems being stable, he is ready for discharge.  The patient has opted to be returned to [facility].  The patient does have a very supportive family.  He was seen by OT, did not feel he needed any ongoing occupational therapy.  Physical Therapy evaluated the patient and felt that he did not need physical therapy.  He did raise the question of Home Health for pulmonary therapy if needed.  DISCHARGE EXAMINATION:  VITAL SIGNS:  Temperature was 97.9, blood pressure 119/60, heart rate 71, respirations 15, oxygen saturation on room air at rest was 96%, with ambulation 92%.  The patient's weight was 123.7 pounds on the day of discharge.  FINAL LABORATORY DATA:  Metabolic panel from the day of discharge with a sodium of 137, potassium 4.1, chloride 99, CO2 of 32, BUN 33, creatinine 0.78, glucose was 109.  Final liver functions from October 31, 2012, were normal except for mild drop in albumin at 2.9.  Final CBC from October 31, 2012, with a white count of 9400, hemoglobin was 10 g and stable, platelet count 141,000.  TSH was checked on October 30, 2012, was normal range at 1.994.  DISCHARGE MEDICATIONS:  The patient is to resume his home medications with some additions, see list below: 1. Albuterol metered-dose inhaler 1 to 2 puffs every 4 hours as needed     for breakthrough wheezing. 2. Fosamax 70 mg once a week. 3. Aspirin 81 mg daily. 4. Vitamin D 1000 international units daily. 5. Diltiazem 120 mg q.24. 6. Ensure 1 can 3 times daily for protein and calorie supplementation. 7. Proscar 5 mg daily. 8. Advair 115/21 metered-dose inhaler 2 puffs a.m. and h.s. 9. Furosemide 40 mg once daily. 10.DuoNeb with a nebulized treatment 4 times daily. 11.Levaquin 500 mg p.o. daily for an additional 3 days. 12.Prednisone taper,  currently at 30 mg daily, we will continue that     for 3 days, 20 mg daily for 3 days, 10 mg daily for 6 days, and     then off. 13.Tamsulosin 0.4 mg q.h.s.  The patient is for discharge home.  At the time of discharge, he is medically stable.  He will be seen in the office for followup in 5 to 7 days.  The patient does have a guarded prognosis given his advanced age and severe COPD.     [name] Nataliyah Packham, MD  MEN/MEDQ  D:  11/02/2012  T:  11/03/2012  Job:  [id number]

## 2012-11-06 ENCOUNTER — Telehealth: Payer: Self-pay

## 2012-11-06 NOTE — Telephone Encounter (Signed)
Phone call from [place], Sports coach at [employer] [phone]. She states patient was d/c on Friday and there were no orders for home health RN or PT. Family is requesting [facility] [phone] Barkley Bruns)

## 2012-11-06 NOTE — Telephone Encounter (Signed)
Barkley Bruns notified that patient may have home health assessment

## 2012-11-06 NOTE — Telephone Encounter (Signed)
OK - did not think he would benefit from [facility] services at time of d/c which is why it wasn't ordered. He was seen by PT Aug 13th [facility] PT was not recommended.   May have HH assessment.

## 2012-11-10 ENCOUNTER — Telehealth: Payer: Self-pay | Admitting: Dietician

## 2012-11-15 ENCOUNTER — Telehealth: Payer: Self-pay | Admitting: Oncology

## 2012-11-15 ENCOUNTER — Ambulatory Visit (INDEPENDENT_AMBULATORY_CARE_PROVIDER_SITE_OTHER): Payer: PRIVATE HEALTH INSURANCE | Admitting: Internal Medicine

## 2012-11-15 ENCOUNTER — Ambulatory Visit: Payer: PRIVATE HEALTH INSURANCE | Admitting: Internal Medicine

## 2012-11-15 ENCOUNTER — Encounter: Payer: Self-pay | Admitting: Internal Medicine

## 2012-11-15 VITALS — BP 110/62 | HR 81 | Temp 97.6°F | Wt 131.0 lb

## 2012-11-15 DIAGNOSIS — E43 Unspecified severe protein-calorie malnutrition: Secondary | ICD-10-CM

## 2012-11-15 DIAGNOSIS — I4891 Unspecified atrial fibrillation: Secondary | ICD-10-CM

## 2012-11-15 DIAGNOSIS — I10 Essential (primary) hypertension: Secondary | ICD-10-CM

## 2012-11-15 DIAGNOSIS — I5032 Chronic diastolic (congestive) heart failure: Secondary | ICD-10-CM

## 2012-11-15 DIAGNOSIS — J438 Other emphysema: Secondary | ICD-10-CM | POA: Insufficient documentation

## 2012-11-15 NOTE — Assessment & Plan Note (Signed)
BP Readings from Last 3 Encounters:  11/15/12 110/62  11/02/12 119/60  10/26/12 109/45   Good control on present medications.  Plan - no change in regimen

## 2012-11-15 NOTE — Assessment & Plan Note (Signed)
Chronic COPD/emphysema. Does well with inhalational treatments. Last hospitalization (Aug '14) RA O2 sate 96% at rest, 92% with exertion.  Plan Continue present treatment with nebulizer.

## 2012-11-15 NOTE — Telephone Encounter (Signed)
Gave pt appt for September  and October lab injections and ML
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
## 2012-11-15 NOTE — Assessment & Plan Note (Signed)
ON exam regular rate.

## 2012-11-15 NOTE — Assessment & Plan Note (Signed)
No evidence of decompensation.  Plan Continue daily weights and "rule of 2's"  Continue present medication

## 2012-11-15 NOTE — Assessment & Plan Note (Signed)
Patient's weight did go down but is slowly rising. He is drinking ensure 2-3 bottles per day.  Plan Continue diet and supplement

## 2012-11-16 ENCOUNTER — Ambulatory Visit: Payer: PRIVATE HEALTH INSURANCE | Admitting: Oncology

## 2012-11-17 ENCOUNTER — Other Ambulatory Visit: Payer: PRIVATE HEALTH INSURANCE | Admitting: Lab

## 2012-11-17 ENCOUNTER — Other Ambulatory Visit: Payer: Self-pay | Admitting: Internal Medicine

## 2012-11-17 DIAGNOSIS — J189 Pneumonia, unspecified organism: Secondary | ICD-10-CM

## 2012-11-17 DIAGNOSIS — I5032 Chronic diastolic (congestive) heart failure: Secondary | ICD-10-CM

## 2012-11-17 DIAGNOSIS — I509 Heart failure, unspecified: Secondary | ICD-10-CM

## 2012-11-17 DIAGNOSIS — J441 Chronic obstructive pulmonary disease with (acute) exacerbation: Secondary | ICD-10-CM

## 2012-11-23 ENCOUNTER — Other Ambulatory Visit (HOSPITAL_BASED_OUTPATIENT_CLINIC_OR_DEPARTMENT_OTHER): Payer: PRIVATE HEALTH INSURANCE | Admitting: Lab

## 2012-11-23 ENCOUNTER — Other Ambulatory Visit: Payer: PRIVATE HEALTH INSURANCE | Admitting: Lab

## 2012-11-23 ENCOUNTER — Ambulatory Visit: Payer: Medicare Other

## 2012-11-23 DIAGNOSIS — D649 Anemia, unspecified: Secondary | ICD-10-CM

## 2012-11-23 DIAGNOSIS — C61 Malignant neoplasm of prostate: Secondary | ICD-10-CM

## 2012-11-23 LAB — CBC WITH DIFFERENTIAL/PLATELET
BASO%: 0.6 % (ref 0.0–2.0)
EOS%: 2.7 % (ref 0.0–7.0)
MCH: 25.3 pg — ABNORMAL LOW (ref 27.2–33.4)
MCHC: 32.1 g/dL (ref 32.0–36.0)
MONO#: 0.4 10*3/uL (ref 0.1–0.9)
NEUT%: 78.3 % — ABNORMAL HIGH (ref 39.0–75.0)
RBC: 4.4 10*6/uL (ref 4.20–5.82)
RDW: 23.5 % — ABNORMAL HIGH (ref 11.0–14.6)
WBC: 6.4 10*3/uL (ref 4.0–10.3)
lymph#: 0.8 10*3/uL — ABNORMAL LOW (ref 0.9–3.3)

## 2012-11-23 MED ORDER — DARBEPOETIN ALFA-POLYSORBATE 300 MCG/0.6ML IJ SOLN: 300.0000 ug | Freq: Once | INTRAMUSCULAR | Status: DC

## 2012-11-29 ENCOUNTER — Other Ambulatory Visit: Payer: Self-pay | Admitting: Internal Medicine

## 2012-12-13 ENCOUNTER — Other Ambulatory Visit: Payer: Self-pay | Admitting: Internal Medicine

## 2012-12-14 ENCOUNTER — Other Ambulatory Visit: Payer: Self-pay | Admitting: Internal Medicine

## 2012-12-21 ENCOUNTER — Ambulatory Visit (HOSPITAL_BASED_OUTPATIENT_CLINIC_OR_DEPARTMENT_OTHER): Payer: PRIVATE HEALTH INSURANCE | Admitting: Oncology

## 2012-12-21 ENCOUNTER — Ambulatory Visit (HOSPITAL_BASED_OUTPATIENT_CLINIC_OR_DEPARTMENT_OTHER): Payer: PRIVATE HEALTH INSURANCE

## 2012-12-21 ENCOUNTER — Telehealth: Payer: Self-pay | Admitting: Oncology

## 2012-12-21 ENCOUNTER — Other Ambulatory Visit (HOSPITAL_BASED_OUTPATIENT_CLINIC_OR_DEPARTMENT_OTHER): Payer: PRIVATE HEALTH INSURANCE | Admitting: Lab

## 2012-12-21 VITALS — BP 119/46 | HR 81 | Temp 97.3°F | Resp 18 | Ht 66.0 in | Wt 130.7 lb

## 2012-12-21 DIAGNOSIS — D638 Anemia in other chronic diseases classified elsewhere: Secondary | ICD-10-CM

## 2012-12-21 DIAGNOSIS — C61 Malignant neoplasm of prostate: Secondary | ICD-10-CM

## 2012-12-21 DIAGNOSIS — N189 Chronic kidney disease, unspecified: Secondary | ICD-10-CM

## 2012-12-21 DIAGNOSIS — D649 Anemia, unspecified: Secondary | ICD-10-CM

## 2012-12-21 LAB — CBC WITH DIFFERENTIAL/PLATELET
BASO%: 1.2 % (ref 0.0–2.0)
Basophils Absolute: 0.1 10*3/uL (ref 0.0–0.1)
HCT: 30.8 % — ABNORMAL LOW (ref 38.4–49.9)
HGB: 10.2 g/dL — ABNORMAL LOW (ref 13.0–17.1)
MCH: 25.7 pg — ABNORMAL LOW (ref 27.2–33.4)
MONO#: 0.5 10*3/uL (ref 0.1–0.9)
NEUT#: 5.9 10*3/uL (ref 1.5–6.5)
RDW: 21.2 % — ABNORMAL HIGH (ref 11.0–14.6)
WBC: 7.8 10*3/uL (ref 4.0–10.3)
lymph#: 1.1 10*3/uL (ref 0.9–3.3)

## 2012-12-21 MED ORDER — DARBEPOETIN ALFA-POLYSORBATE 300 MCG/0.6ML IJ SOLN
300.0000 ug | Freq: Once | INTRAMUSCULAR | Status: AC
  Administered 2012-12-21: 300 ug via SUBCUTANEOUS
  Filled 2012-12-21: qty 0.6

## 2012-12-21 NOTE — Progress Notes (Signed)
Hematology and Oncology Follow Up Visit  [name] [id number] 12/03/1926 79 y.o. 12/21/2012 1:36 PM [name], MDNorins, Dave Gess, MD   Principle Diagnosis: 79 year old with:  1. Multifactorial anemia, likely due to chronic disease. Diagnosed in 2011. He was under the care of Dr. Abbe [name] in [place].  2. IgA MGUS 3. History of prostate cancer.    Prior Therapy: S/P bone marrow biopsy in 10/2009. It did not show MDS or plasma cell disorder. Received Feraheme 510 mg IV on 05/18/12.  Current therapy: Aransep 300 mcg every  Four weeks to keep hemoglobin above 11.   Interim History:  [name] is a an 79 year old man who returns for a followup visit by himself. He is a nice man with above issues. Since his last visit he was hospitalized in August of 2014 due to COPD exacerbation and possible community acquired pneumonia. Since his discharge, he has been feeling better and have regained most activities of daily living. He is breathing without any difficulties today and not reporting any chest pain or shortness of breath. He denies fevers or difficulty breathing. No other complications noted related to Aranesp or anemia.    Medications: I have reviewed the patient's current medications.  Current Outpatient Prescriptions  Medication Sig Dispense Refill  . ADVAIR HFA 115-21 MCG/ACT inhaler INHALE 2 PUFFS INTO THE LUNGS 2 (TWO) TIMES DAILY.  12 g  11  . albuterol (PROVENTIL HFA;VENTOLIN HFA) 108 (90 BASE) MCG/ACT inhaler Inhale 1-3 puffs into the lungs every 4 (four) hours as needed for wheezing or shortness of breath.      [name] alendronate (FOSAMAX) 70 MG tablet Take 70 mg by mouth every 7 (seven) days. Take with a full glass of water on an empty stomach.      [name] aspirin EC 81 MG tablet Take 81 mg by mouth daily.      . Cholecalciferol (VITAMIN D3) 1000 UNITS CAPS Take 1 capsule by mouth daily.        [name] diltiazem (CARDIZEM CD) 120 MG 24 hr capsule Take 120 mg by mouth daily.      [name]  diltiazem (CARDIZEM CD) 120 MG 24 hr capsule TAKE ONE CAPSULE BY MOUTH EVERY DAY  30 capsule  11  . feeding supplement (ENSURE COMPLETE) LIQD Take 237 mLs by mouth 3 (three) times daily between meals.      . finasteride (PROSCAR) 5 MG tablet Take 5 mg by mouth daily.      . fluticasone-salmeterol (ADVAIR HFA) 115-21 MCG/ACT inhaler Inhale 2 puffs into the lungs 2 (two) times daily.      . furosemide (LASIX) 40 MG tablet Take 40 mg by mouth daily.      [name] gabapentin (NEURONTIN) 300 MG capsule TAKE AT BEDTIME. WE CAN INCREASE THE DOSE AS NEEDED.  30 capsule  11  . ipratropium-albuterol (DUONEB) 0.5-2.5 (3) MG/3ML SOLN USE 1 VIAL PER NEBULIZER 4 TIMES A DAY  360 mL  5  . levofloxacin (LEVAQUIN) 500 MG tablet Take 1 tablet (500 mg total) by mouth daily.  3 tablet  0  . predniSONE (DELTASONE) 10 MG tablet Take 3 tablets (30 mg total) by mouth as directed. X 3 days, 2 tabs daily x 3 days, 1 tab daily x 6 days and stop  21 tablet  0  . PROAIR HFA 108 (90 BASE) MCG/ACT inhaler USE AS DIRECTED  8.5 each  2  . tamsulosin (FLOMAX) 0.4 MG CAPS Take 0.4 mg by mouth daily.  No current facility-administered medications for this visit.   Facility-Administered Medications Ordered in Other Visits  Medication Dose Route Frequency Provider Last Rate Last Dose  . darbepoetin (ARANESP) injection 300 mcg  300 mcg Subcutaneous Once Dave Core, MD        Allergies: No Known Allergies  Past Medical History, Surgical history, Social history, and Family History were reviewed and updated.  Review of Systems:  Remaining ROS negative.  Physical Exam: Blood pressure 119/46, pulse 81, temperature 97.3 F (36.3 C), temperature source Oral, resp. rate 18, height 5\' 6"  (1.676 m), weight 130 lb 11.2 oz (59.285 kg). ECOG: 1 General appearance: alert Head: Normocephalic, without obvious abnormality, atraumatic Neck: no adenopathy, no carotid bruit, no JVD, supple, symmetrical, trachea midline and thyroid not  enlarged, symmetric, no tenderness/mass/nodules Lymph nodes: Cervical, supraclavicular, and axillary nodes normal. Heart:regular rate and rhythm, S1, S2 normal, no murmur, click, rub or gallop Lung:chest clear, no wheezing, rales, normal symmetric air entry Abdomen: soft, non-tender, without masses or organomegaly EXT:no erythema, induration, or nodules   Lab Results: Lab Results  Component Value Date   WBC 7.8 12/21/2012   HGB 10.2* 12/21/2012   HCT 30.8* 12/21/2012   MCV 77.9* 12/21/2012   PLT 118* 12/21/2012     Chemistry      Component Value Date/Time   NA 137 11/02/2012 0448   NA 142 04/11/2012 0950   K 4.1 11/02/2012 0448   K 3.9 04/11/2012 0950   CL 99 11/02/2012 0448   CL 105 04/11/2012 0950   CO2 32 11/02/2012 0448   CO2 29 04/11/2012 0950   BUN 33* 11/02/2012 0448   BUN 20.0 04/11/2012 0950   CREATININE 0.78 11/02/2012 0448   CREATININE 0.9 04/11/2012 0950      Component Value Date/Time   CALCIUM 9.4 11/02/2012 0448   CALCIUM 9.1 04/11/2012 0950   ALKPHOS 49 10/31/2012 0403   ALKPHOS 60 04/11/2012 0950   AST 10 10/31/2012 0403   AST 12 04/11/2012 0950   ALT 6 10/31/2012 0403   ALT 7 04/11/2012 0950   BILITOT 0.3 10/31/2012 0403   BILITOT 0.37 04/11/2012 0950      Impression and Plan:  79 year old with the following issues:  1. Multifactorial anemia: He has an element of chronic disease, renal disease and possible B12 and iron deficiency.  S/P Feraheme on 05/18/12. Ferritin was >500 in May 2014. I will continue his Aransep on monthly basis to keep his Hgb above 11. He will receive an Aranesp injection today after reviewing his hemoglobin results today of 10.2  2. Elevated IgA in the past. I see no evidence of a a plasma cell disorder. He was found to have a benign bone lesion in the past. I will repeat serum protein electrophoresis with the next visit.  3. COPD: he is followd be Dr. Debby [name] for that. He is status post recent hospitalization which seems to have stabilized  4.  history of prostate cancer: Continue to be on active surveillance without any evidence of relapse.  4. Follow up: in 6 months.    [name] 10/2/20141:36 PM

## 2012-12-21 NOTE — Telephone Encounter (Signed)
gv and printed appt sched and avs forpt for OCT thru March 2015

## 2013-01-02 ENCOUNTER — Ambulatory Visit: Payer: PRIVATE HEALTH INSURANCE | Admitting: Oncology

## 2013-01-15 ENCOUNTER — Ambulatory Visit (INDEPENDENT_AMBULATORY_CARE_PROVIDER_SITE_OTHER): Payer: PRIVATE HEALTH INSURANCE | Admitting: Internal Medicine

## 2013-01-15 ENCOUNTER — Encounter: Payer: Self-pay | Admitting: Internal Medicine

## 2013-01-15 VITALS — BP 118/64 | HR 96 | Temp 97.8°F | Wt 132.4 lb

## 2013-01-15 DIAGNOSIS — I10 Essential (primary) hypertension: Secondary | ICD-10-CM

## 2013-01-15 DIAGNOSIS — I5032 Chronic diastolic (congestive) heart failure: Secondary | ICD-10-CM

## 2013-01-15 DIAGNOSIS — Z23 Encounter for immunization: Secondary | ICD-10-CM

## 2013-01-15 DIAGNOSIS — J438 Other emphysema: Secondary | ICD-10-CM

## 2013-01-15 DIAGNOSIS — E43 Unspecified severe protein-calorie malnutrition: Secondary | ICD-10-CM

## 2013-01-15 NOTE — Patient Instructions (Signed)
1. Respiratory - lungs sound clear today and oxygen level is good - 96%. Plan  continue present medications.  2. Nutrition and weight - you have gained 9 lbs since early August. Plan Three squares and snack every day.  Nutritional supplement: 2 ensures per day or, as a less expensive alternative, Carnation instant breakfast with whole milk plus vitamin and protein powder.
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
## 2013-01-15 NOTE — Assessment & Plan Note (Signed)
He has gained a few pounds. Ensure is very expensive  Plan Continue nutritional supplement - suggested using carnation instant breakfast with supplements

## 2013-01-15 NOTE — Assessment & Plan Note (Signed)
BP Readings from Last 3 Encounters:  01/15/13 118/64  12/21/12 119/46  11/15/12 110/62   Good control on present medications

## 2013-01-15 NOTE — Assessment & Plan Note (Signed)
Appears well compensated at today's exam  Plan Continue present regimen

## 2013-01-15 NOTE — Assessment & Plan Note (Signed)
At exam no shortness of breath at rest. Walked in w/o using oxygen. Using nebulizer treatments at home (cost reasons) and doing well.  Plan No change in regimen

## 2013-01-18 ENCOUNTER — Other Ambulatory Visit (HOSPITAL_BASED_OUTPATIENT_CLINIC_OR_DEPARTMENT_OTHER): Payer: PRIVATE HEALTH INSURANCE | Admitting: Lab

## 2013-01-18 ENCOUNTER — Ambulatory Visit (HOSPITAL_BASED_OUTPATIENT_CLINIC_OR_DEPARTMENT_OTHER): Payer: PRIVATE HEALTH INSURANCE

## 2013-01-18 VITALS — BP 105/50 | HR 84 | Temp 97.4°F

## 2013-01-18 DIAGNOSIS — D649 Anemia, unspecified: Secondary | ICD-10-CM

## 2013-01-18 DIAGNOSIS — D638 Anemia in other chronic diseases classified elsewhere: Secondary | ICD-10-CM

## 2013-01-18 DIAGNOSIS — C61 Malignant neoplasm of prostate: Secondary | ICD-10-CM

## 2013-01-18 DIAGNOSIS — N189 Chronic kidney disease, unspecified: Secondary | ICD-10-CM

## 2013-01-18 LAB — CBC WITH DIFFERENTIAL/PLATELET
BASO%: 0.1 % (ref 0.0–2.0)
EOS%: 0.6 % (ref 0.0–7.0)
LYMPH%: 12.6 % — ABNORMAL LOW (ref 14.0–49.0)
MCH: 25.1 pg — ABNORMAL LOW (ref 27.2–33.4)
MCHC: 31.6 g/dL — ABNORMAL LOW (ref 32.0–36.0)
MONO#: 0.4 10*3/uL (ref 0.1–0.9)
Platelets: 144 10*3/uL (ref 140–400)
RBC: 4.23 10*6/uL (ref 4.20–5.82)
WBC: 8 10*3/uL (ref 4.0–10.3)

## 2013-01-18 LAB — FERRITIN CHCC: Ferritin: 365 ng/ml — ABNORMAL HIGH (ref 22–316)

## 2013-01-18 LAB — IRON AND TIBC CHCC: UIBC: 150 ug/dL (ref 117–376)

## 2013-01-18 MED ORDER — DARBEPOETIN ALFA-POLYSORBATE 300 MCG/0.6ML IJ SOLN
300.0000 ug | Freq: Once | INTRAMUSCULAR | Status: AC
  Administered 2013-01-18: 300 ug via SUBCUTANEOUS
  Filled 2013-01-18: qty 0.6

## 2013-02-13 ENCOUNTER — Other Ambulatory Visit: Payer: Self-pay | Admitting: Oncology

## 2013-02-16 ENCOUNTER — Other Ambulatory Visit (HOSPITAL_BASED_OUTPATIENT_CLINIC_OR_DEPARTMENT_OTHER): Payer: PRIVATE HEALTH INSURANCE | Admitting: Lab

## 2013-02-16 ENCOUNTER — Ambulatory Visit (HOSPITAL_BASED_OUTPATIENT_CLINIC_OR_DEPARTMENT_OTHER): Payer: PRIVATE HEALTH INSURANCE

## 2013-02-16 VITALS — BP 123/53 | HR 90 | Temp 97.7°F

## 2013-02-16 DIAGNOSIS — D649 Anemia, unspecified: Secondary | ICD-10-CM

## 2013-02-16 DIAGNOSIS — N189 Chronic kidney disease, unspecified: Secondary | ICD-10-CM

## 2013-02-16 DIAGNOSIS — D638 Anemia in other chronic diseases classified elsewhere: Secondary | ICD-10-CM

## 2013-02-16 DIAGNOSIS — C61 Malignant neoplasm of prostate: Secondary | ICD-10-CM

## 2013-02-16 LAB — CBC WITH DIFFERENTIAL/PLATELET
Basophils Absolute: 0.1 10*3/uL (ref 0.0–0.1)
EOS%: 2.4 % (ref 0.0–7.0)
Eosinophils Absolute: 0.2 10*3/uL (ref 0.0–0.5)
HCT: 33.6 % — ABNORMAL LOW (ref 38.4–49.9)
HGB: 10.1 g/dL — ABNORMAL LOW (ref 13.0–17.1)
LYMPH%: 15 % (ref 14.0–49.0)
MCH: 23.9 pg — ABNORMAL LOW (ref 27.2–33.4)
MCV: 80 fL (ref 79.3–98.0)
MONO%: 8.4 % (ref 0.0–14.0)
NEUT#: 5.5 10*3/uL (ref 1.5–6.5)
NEUT%: 73.4 % (ref 39.0–75.0)
Platelets: 140 10*3/uL (ref 140–400)

## 2013-02-16 MED ORDER — DARBEPOETIN ALFA-POLYSORBATE 300 MCG/0.6ML IJ SOLN
300.0000 ug | Freq: Once | INTRAMUSCULAR | Status: AC
  Administered 2013-02-16: 300 ug via SUBCUTANEOUS
  Filled 2013-02-16: qty 0.6

## 2013-03-07 ENCOUNTER — Encounter: Payer: Self-pay | Admitting: Internal Medicine

## 2013-03-07 ENCOUNTER — Ambulatory Visit (INDEPENDENT_AMBULATORY_CARE_PROVIDER_SITE_OTHER): Payer: PRIVATE HEALTH INSURANCE | Admitting: Internal Medicine

## 2013-03-07 VITALS — BP 110/58 | HR 93 | Ht 66.0 in | Wt 136.8 lb

## 2013-03-07 DIAGNOSIS — I5032 Chronic diastolic (congestive) heart failure: Secondary | ICD-10-CM

## 2013-03-07 DIAGNOSIS — I4891 Unspecified atrial fibrillation: Secondary | ICD-10-CM

## 2013-03-07 NOTE — Assessment & Plan Note (Signed)
Paroxysmal

## 2013-03-07 NOTE — Assessment & Plan Note (Signed)
Mildly volume overload and a feeling 42 well so we'll not make further adjustments. I worry a little bit about orthostatic intolerance

## 2013-03-07 NOTE — Patient Instructions (Signed)
Your physician recommends that you continue on your current medications as directed. Please refer to the Current Medication list given to you today.   Your physician recommends that you schedule a follow-up appointment in: 6 months with [name].  Your physician recommends that you schedule a follow-up appointment in: 12 months with Dr. Sherryl Manges.
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
## 2013-03-16 ENCOUNTER — Ambulatory Visit (HOSPITAL_BASED_OUTPATIENT_CLINIC_OR_DEPARTMENT_OTHER): Payer: PRIVATE HEALTH INSURANCE

## 2013-03-16 ENCOUNTER — Other Ambulatory Visit (HOSPITAL_BASED_OUTPATIENT_CLINIC_OR_DEPARTMENT_OTHER): Payer: PRIVATE HEALTH INSURANCE

## 2013-03-16 VITALS — BP 102/38 | HR 86 | Temp 97.7°F

## 2013-03-16 DIAGNOSIS — D638 Anemia in other chronic diseases classified elsewhere: Secondary | ICD-10-CM

## 2013-03-16 DIAGNOSIS — C61 Malignant neoplasm of prostate: Secondary | ICD-10-CM

## 2013-03-16 DIAGNOSIS — D649 Anemia, unspecified: Secondary | ICD-10-CM

## 2013-03-16 DIAGNOSIS — N189 Chronic kidney disease, unspecified: Secondary | ICD-10-CM

## 2013-03-16 LAB — CBC WITH DIFFERENTIAL/PLATELET
EOS%: 2.4 % (ref 0.0–7.0)
Eosinophils Absolute: 0.2 10*3/uL (ref 0.0–0.5)
LYMPH%: 12.9 % — ABNORMAL LOW (ref 14.0–49.0)
MCH: 23.7 pg — ABNORMAL LOW (ref 27.2–33.4)
MCV: 77.2 fL — ABNORMAL LOW (ref 79.3–98.0)
MONO%: 6.1 % (ref 0.0–14.0)
Platelets: 142 10*3/uL (ref 140–400)
RBC: 4.25 10*6/uL (ref 4.20–5.82)
RDW: 19.9 % — ABNORMAL HIGH (ref 11.0–14.6)

## 2013-03-16 MED ORDER — DARBEPOETIN ALFA-POLYSORBATE 300 MCG/0.6ML IJ SOLN
300.0000 ug | Freq: Once | INTRAMUSCULAR | Status: AC
  Administered 2013-03-16: 300 ug via SUBCUTANEOUS
  Filled 2013-03-16: qty 0.6

## 2013-03-20 ENCOUNTER — Ambulatory Visit (INDEPENDENT_AMBULATORY_CARE_PROVIDER_SITE_OTHER): Payer: PRIVATE HEALTH INSURANCE | Admitting: Internal Medicine

## 2013-03-20 ENCOUNTER — Encounter: Payer: Self-pay | Admitting: Internal Medicine

## 2013-03-20 VITALS — BP 130/62 | HR 99 | Temp 97.6°F | Wt 135.0 lb

## 2013-03-20 DIAGNOSIS — I4891 Unspecified atrial fibrillation: Secondary | ICD-10-CM

## 2013-03-20 DIAGNOSIS — J438 Other emphysema: Secondary | ICD-10-CM

## 2013-03-20 DIAGNOSIS — I10 Essential (primary) hypertension: Secondary | ICD-10-CM

## 2013-03-20 DIAGNOSIS — I5032 Chronic diastolic (congestive) heart failure: Secondary | ICD-10-CM

## 2013-03-20 DIAGNOSIS — D649 Anemia, unspecified: Secondary | ICD-10-CM

## 2013-03-20 NOTE — Patient Instructions (Signed)
Happy New Year  Your lungs are clear, heart sounds good.  You are doing well. Please continue all your present medications.   See you in February 15

## 2013-03-20 NOTE — Progress Notes (Signed)
Pre visit review using our clinic review tool, if applicable. No additional management support is needed unless otherwise documented below in the visit note. 

## 2013-03-22 NOTE — Assessment & Plan Note (Addendum)
No SOB at rest. He denies any limitations -but he doesn't do much. He is not using oxygen and his O2 sat today is fine at 95% on room air.   Plan Continue present regimen.

## 2013-03-22 NOTE — Assessment & Plan Note (Signed)
Doing well at today's exam. No signs of decompensation.  Plan Continue present medical regimen.

## 2013-03-22 NOTE — Assessment & Plan Note (Signed)
PAF - in sinus rhythm at today's exam. He denies any poblems: no chest pain, no near syncope or palpitations.  Plan Continue present regimen.

## 2013-03-22 NOTE — Assessment & Plan Note (Signed)
BP Readings from Last 3 Encounters:  03/20/13 130/62  03/16/13 102/38  03/07/13 110/58   Good control on his present regimen

## 2013-03-22 NOTE — Progress Notes (Signed)
   Subjective:    Patient ID: [name], male    DOB: [birth year], 80 y.o.   MRN: [id number]  HPI [name] presents for follow up of diastolic heart failure and emphysema. He was last hospitalized 8/11-8/14/14. Last OV [facility] Oct 27th. In the interval he has been followed by Dr. Alen Blew for anemia and has had aranesp infusions monthly - last 12/26. He has also seen Dr. Caryl Comes 12/17 for follow up a. Fib. He was stable. Dr. Caryl Comes was concerned for mild fluid overload but did not further adjust his medications.   [name] reports that he is doing well. He denies c/p, limiting SOB or cough.  PMH, FamHx and SocHx reviewed for any changes and relevance. ' Current Outpatient Prescriptions on File Prior to Visit  Medication Sig Dispense Refill  . ADVAIR HFA 115-21 MCG/ACT inhaler INHALE 2 PUFFS INTO THE LUNGS 2 (TWO) TIMES DAILY.  12 g  11  . albuterol (PROVENTIL HFA;VENTOLIN HFA) 108 (90 BASE) MCG/ACT inhaler Inhale 1-3 puffs into the lungs every 4 (four) hours as needed for wheezing or shortness of breath.      [name] alendronate (FOSAMAX) 70 MG tablet Take 70 mg by mouth every 7 (seven) days. Take with a full glass of water on an empty stomach.      [name] aspirin EC 81 MG tablet Take 81 mg by mouth daily.      [name] diltiazem (CARDIZEM CD) 120 MG 24 hr capsule Take 120 mg by mouth daily.      . finasteride (PROSCAR) 5 MG tablet Take 5 mg by mouth daily.      . furosemide (LASIX) 40 MG tablet Take 40 mg by mouth daily.      [name] gabapentin (NEURONTIN) 300 MG capsule TAKE AT BEDTIME. WE CAN INCREASE THE DOSE AS NEEDED.  30 capsule  11  . tamsulosin (FLOMAX) 0.4 MG CAPS Take 0.4 mg by mouth daily.       No current facility-administered medications on file prior to visit.      Review of Systems  Constitutional: Negative for diaphoresis, activity change, appetite change and unexpected weight change.  HENT: Negative.   Eyes: Negative for discharge, redness and visual disturbance.  Respiratory: Positive for  shortness of breath. Negative for cough, choking, chest tightness and wheezing.   Cardiovascular: Negative for chest pain and leg swelling.  Gastrointestinal: Negative.   Endocrine: Negative.   Genitourinary: Negative.   Musculoskeletal: Negative.   Skin: Negative.   Allergic/Immunologic: Negative.   Neurological: Negative.   Hematological: Negative.   Psychiatric/Behavioral: Negative.        Objective:   Physical Exam Filed Vitals:   03/20/13 1259  BP: 130/62  Pulse: 99  Temp: 97.6 F (36.4 C)   Wt Readings from Last 3 Encounters:  03/20/13 135 lb (61.236 kg)  03/07/13 136 lb 12.8 oz (62.052 kg)  01/15/13 132 lb 6.4 oz (60.056 kg)   BP Readings from Last 3 Encounters:  03/20/13 130/62  03/16/13 102/38  03/07/13 110/58   Gen'l- elderly, very thin AA man in no distress. HEENT- C&S muddy Cor - 2+ radial pulse, RRR Pulm - decreased breath sounds. No rales or wheezes on exam.  Neuro - A&O x 3, walked in w/o assistance       Assessment & Plan:

## 2013-03-22 NOTE — Assessment & Plan Note (Signed)
Taking monthly aranesp infusions Lab Results  Component Value Date   HGB 10.1* 03/16/2013   Plan Per Dr. Alen Blew.

## 2013-03-24 ENCOUNTER — Other Ambulatory Visit: Payer: Self-pay | Admitting: Internal Medicine

## 2013-03-26 ENCOUNTER — Telehealth: Payer: Self-pay

## 2013-03-26 MED ORDER — AEROCHAMBER PLUS FLO-VU MEDIUM MISC: 1.0000 | Freq: Once | Status: AC

## 2013-03-26 NOTE — Telephone Encounter (Signed)
Phone call from Bradd Canary NP with [facility] [phone] states she will be out to visit patient this afternoon at 3:00 and he needs an inhaler spacer that she can pick before his appt?

## 2013-03-26 NOTE — Telephone Encounter (Signed)
aerochamber sent to pharmacy

## 2013-03-26 NOTE — Telephone Encounter (Signed)
[name] has been notified via voicemail

## 2013-03-27 ENCOUNTER — Telehealth: Payer: Self-pay

## 2013-03-27 ENCOUNTER — Emergency Department (HOSPITAL_COMMUNITY): Payer: PRIVATE HEALTH INSURANCE

## 2013-03-27 ENCOUNTER — Encounter (HOSPITAL_COMMUNITY): Payer: Self-pay | Admitting: Emergency Medicine

## 2013-03-27 ENCOUNTER — Inpatient Hospital Stay (HOSPITAL_COMMUNITY)
Admission: EM | Admit: 2013-03-27 | Discharge: 2013-03-29 | DRG: 191 | Disposition: A | Discharge status: Home Health | Payer: PRIVATE HEALTH INSURANCE | Attending: Family Medicine | Admitting: Family Medicine

## 2013-03-27 DIAGNOSIS — I059 Rheumatic mitral valve disease, unspecified: Secondary | ICD-10-CM | POA: Diagnosis present

## 2013-03-27 DIAGNOSIS — M898X9 Other specified disorders of bone, unspecified site: Secondary | ICD-10-CM

## 2013-03-27 DIAGNOSIS — Z8249 Family history of ischemic heart disease and other diseases of the circulatory system: Secondary | ICD-10-CM

## 2013-03-27 DIAGNOSIS — M8500 Fibrous dysplasia (monostotic), unspecified site: Secondary | ICD-10-CM

## 2013-03-27 DIAGNOSIS — D472 Monoclonal gammopathy: Secondary | ICD-10-CM | POA: Diagnosis present

## 2013-03-27 DIAGNOSIS — I509 Heart failure, unspecified: Secondary | ICD-10-CM | POA: Diagnosis present

## 2013-03-27 DIAGNOSIS — Z87891 Personal history of nicotine dependence: Secondary | ICD-10-CM

## 2013-03-27 DIAGNOSIS — M8560 Other cyst of bone, unspecified site: Secondary | ICD-10-CM | POA: Diagnosis present

## 2013-03-27 DIAGNOSIS — I079 Rheumatic tricuspid valve disease, unspecified: Secondary | ICD-10-CM | POA: Diagnosis present

## 2013-03-27 DIAGNOSIS — I5032 Chronic diastolic (congestive) heart failure: Secondary | ICD-10-CM

## 2013-03-27 DIAGNOSIS — R0602 Shortness of breath: Secondary | ICD-10-CM

## 2013-03-27 DIAGNOSIS — M899 Disorder of bone, unspecified: Secondary | ICD-10-CM

## 2013-03-27 DIAGNOSIS — S68118A Complete traumatic metacarpophalangeal amputation of other finger, initial encounter: Secondary | ICD-10-CM

## 2013-03-27 DIAGNOSIS — Z8546 Personal history of malignant neoplasm of prostate: Secondary | ICD-10-CM

## 2013-03-27 DIAGNOSIS — D649 Anemia, unspecified: Secondary | ICD-10-CM

## 2013-03-27 DIAGNOSIS — Z79899 Other long term (current) drug therapy: Secondary | ICD-10-CM

## 2013-03-27 DIAGNOSIS — D509 Iron deficiency anemia, unspecified: Secondary | ICD-10-CM | POA: Diagnosis present

## 2013-03-27 DIAGNOSIS — J441 Chronic obstructive pulmonary disease with (acute) exacerbation: Principal | ICD-10-CM

## 2013-03-27 DIAGNOSIS — I2789 Other specified pulmonary heart diseases: Secondary | ICD-10-CM | POA: Diagnosis present

## 2013-03-27 DIAGNOSIS — Z7982 Long term (current) use of aspirin: Secondary | ICD-10-CM

## 2013-03-27 DIAGNOSIS — D696 Thrombocytopenia, unspecified: Secondary | ICD-10-CM

## 2013-03-27 DIAGNOSIS — I10 Essential (primary) hypertension: Secondary | ICD-10-CM

## 2013-03-27 DIAGNOSIS — E538 Deficiency of other specified B group vitamins: Secondary | ICD-10-CM

## 2013-03-27 DIAGNOSIS — N400 Enlarged prostate without lower urinary tract symptoms: Secondary | ICD-10-CM

## 2013-03-27 DIAGNOSIS — J438 Other emphysema: Secondary | ICD-10-CM

## 2013-03-27 DIAGNOSIS — E43 Unspecified severe protein-calorie malnutrition: Secondary | ICD-10-CM

## 2013-03-27 DIAGNOSIS — I071 Rheumatic tricuspid insufficiency: Secondary | ICD-10-CM

## 2013-03-27 DIAGNOSIS — C61 Malignant neoplasm of prostate: Secondary | ICD-10-CM

## 2013-03-27 DIAGNOSIS — I4891 Unspecified atrial fibrillation: Secondary | ICD-10-CM

## 2013-03-27 LAB — BASIC METABOLIC PANEL
BUN: 18 mg/dL (ref 6–23)
CO2: 24 mEq/L (ref 19–32)
Calcium: 9.2 mg/dL (ref 8.4–10.5)
Chloride: 102 mEq/L (ref 96–112)
Creatinine, Ser: 0.86 mg/dL (ref 0.50–1.35)
GFR, EST AFRICAN AMERICAN: 90 mL/min — AB (ref >90)
GFR, EST NON AFRICAN AMERICAN: 77 mL/min — AB (ref >90)
GLUCOSE: 160 mg/dL — AB (ref 70–99)
Potassium: 4.2 mEq/L (ref 3.7–5.3)
Sodium: 141 mEq/L (ref 137–147)

## 2013-03-27 LAB — CBC
HCT: 34.6 % — ABNORMAL LOW (ref 39.0–52.0)
Hemoglobin: 11 g/dL — ABNORMAL LOW (ref 13.0–17.0)
MCH: 24.9 pg — AB (ref 26.0–34.0)
MCHC: 31.8 g/dL (ref 30.0–36.0)
MCV: 78.3 fL (ref 78.0–100.0)
PLATELETS: 161 10*3/uL (ref 150–400)
RBC: 4.42 MIL/uL (ref 4.22–5.81)
RDW: 19.2 % — ABNORMAL HIGH (ref 11.5–15.5)
WBC: 17.6 10*3/uL — ABNORMAL HIGH (ref 4.0–10.5)

## 2013-03-27 LAB — POCT I-STAT TROPONIN I: TROPONIN I, POC: 0.01 ng/mL (ref 0.00–0.08)

## 2013-03-27 LAB — PRO B NATRIURETIC PEPTIDE: Pro B Natriuretic peptide (BNP): 981.4 pg/mL — ABNORMAL HIGH (ref 0–450)

## 2013-03-27 MED ORDER — ALBUTEROL (5 MG/ML) CONTINUOUS INHALATION SOLN
10.0000 mg/h | INHALATION_SOLUTION | Freq: Once | RESPIRATORY_TRACT | Status: AC
  Administered 2013-03-27: 10 mg/h via RESPIRATORY_TRACT
  Filled 2013-03-27: qty 20

## 2013-03-27 MED ORDER — DILTIAZEM HCL ER COATED BEADS 120 MG PO CP24
120.0000 mg | ORAL_CAPSULE | Freq: Every day | ORAL | Status: DC
  Administered 2013-03-28 – 2013-03-29 (×2): 120 mg via ORAL
  Filled 2013-03-27 (×2): qty 1

## 2013-03-27 MED ORDER — LEVOFLOXACIN IN D5W 750 MG/150ML IV SOLN
750.0000 mg | Freq: Once | INTRAVENOUS | Status: AC
  Administered 2013-03-27: 750 mg via INTRAVENOUS
  Filled 2013-03-27: qty 150

## 2013-03-27 MED ORDER — IPRATROPIUM-ALBUTEROL 0.5-2.5 (3) MG/3ML IN SOLN
3.0000 mL | Freq: Four times a day (QID) | RESPIRATORY_TRACT | Status: DC
  Administered 2013-03-28 – 2013-03-29 (×6): 3 mL via RESPIRATORY_TRACT
  Filled 2013-03-27 (×35): qty 3

## 2013-03-27 MED ORDER — IPRATROPIUM BROMIDE 0.02 % IN SOLN
0.5000 mg | Freq: Once | RESPIRATORY_TRACT | Status: AC
  Administered 2013-03-27: 0.5 mg via RESPIRATORY_TRACT
  Filled 2013-03-27: qty 2.5

## 2013-03-27 MED ORDER — IPRATROPIUM BROMIDE 0.02 % IN SOLN: 0.5000 mg | Freq: Four times a day (QID) | RESPIRATORY_TRACT | Status: DC

## 2013-03-27 MED ORDER — GABAPENTIN 300 MG PO CAPS
300.0000 mg | ORAL_CAPSULE | Freq: Every day | ORAL | Status: DC
  Administered 2013-03-28 – 2013-03-29 (×2): 300 mg via ORAL
  Filled 2013-03-27 (×2): qty 1

## 2013-03-27 MED ORDER — ALBUTEROL SULFATE (2.5 MG/3ML) 0.083% IN NEBU: 2.5000 mg | INHALATION_SOLUTION | RESPIRATORY_TRACT | Status: DC | PRN

## 2013-03-27 MED ORDER — METHYLPREDNISOLONE SODIUM SUCC 125 MG IJ SOLR
125.0000 mg | Freq: Once | INTRAMUSCULAR | Status: AC
  Administered 2013-03-27: 125 mg via INTRAVENOUS
  Filled 2013-03-27: qty 2

## 2013-03-27 MED ORDER — METHYLPREDNISOLONE SODIUM SUCC 125 MG IJ SOLR
80.0000 mg | Freq: Two times a day (BID) | INTRAMUSCULAR | Status: DC
  Administered 2013-03-28 – 2013-03-29 (×3): 80 mg via INTRAVENOUS
  Filled 2013-03-27 (×5): qty 1.28

## 2013-03-27 MED ORDER — SODIUM CHLORIDE 0.9 % IJ SOLN: 3.0000 mL | Freq: Two times a day (BID) | INTRAMUSCULAR | Status: DC

## 2013-03-27 MED ORDER — ALBUTEROL SULFATE (2.5 MG/3ML) 0.083% IN NEBU: 2.5000 mg | INHALATION_SOLUTION | Freq: Four times a day (QID) | RESPIRATORY_TRACT | Status: DC

## 2013-03-27 MED ORDER — TAMSULOSIN HCL 0.4 MG PO CAPS
0.4000 mg | ORAL_CAPSULE | Freq: Every day | ORAL | Status: DC
  Administered 2013-03-28 – 2013-03-29 (×2): 0.4 mg via ORAL
  Filled 2013-03-27 (×2): qty 1

## 2013-03-27 MED ORDER — GUAIFENESIN-DM 100-10 MG/5ML PO SYRP: 5.0000 mL | ORAL_SOLUTION | ORAL | Status: DC | PRN

## 2013-03-27 MED ORDER — FUROSEMIDE 40 MG PO TABS
40.0000 mg | ORAL_TABLET | Freq: Every day | ORAL | Status: DC
  Administered 2013-03-28 – 2013-03-29 (×2): 40 mg via ORAL
  Filled 2013-03-27 (×2): qty 1

## 2013-03-27 MED ORDER — SODIUM CHLORIDE 0.9 % IJ SOLN: 3.0000 mL | INTRAMUSCULAR | Status: DC | PRN

## 2013-03-27 MED ORDER — SODIUM CHLORIDE 0.9 % IV SOLN: 250.0000 mL | INTRAVENOUS | Status: DC | PRN

## 2013-03-27 MED ORDER — ASPIRIN EC 81 MG PO TBEC
81.0000 mg | DELAYED_RELEASE_TABLET | Freq: Every day | ORAL | Status: DC
  Administered 2013-03-28 – 2013-03-29 (×2): 81 mg via ORAL
  Filled 2013-03-27 (×2): qty 1

## 2013-03-27 MED ORDER — FINASTERIDE 5 MG PO TABS
5.0000 mg | ORAL_TABLET | Freq: Every day | ORAL | Status: DC
  Administered 2013-03-28 – 2013-03-29 (×2): 5 mg via ORAL
  Filled 2013-03-27 (×2): qty 1

## 2013-03-27 NOTE — ED Notes (Addendum)
Pt has hx of COPD and CHF, today is having difficulty breathing it has been going on for 1 week but today its worse. Its more with exertion but is happening now when just relaxed. Pt is on prednisone started yesterday for COPD. Denies pain but has chest tightness.  Pt has strong unproductive cough x 5-6 months.

## 2013-03-27 NOTE — Telephone Encounter (Signed)
Phone call from Dawayne Patricia NP with [facility] [phone] states patient called her and said his breathing is getting worse. He is going to go to the ER at [facility]. She wanted you to be notified.
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
## 2013-03-28 ENCOUNTER — Encounter (HOSPITAL_COMMUNITY): Payer: Self-pay | Admitting: *Deleted

## 2013-03-28 DIAGNOSIS — E43 Unspecified severe protein-calorie malnutrition: Secondary | ICD-10-CM

## 2013-03-28 LAB — BASIC METABOLIC PANEL
BUN: 18 mg/dL (ref 6–23)
CO2: 23 mEq/L (ref 19–32)
Calcium: 8.9 mg/dL (ref 8.4–10.5)
Chloride: 100 mEq/L (ref 96–112)
Creatinine, Ser: 0.77 mg/dL (ref 0.50–1.35)
GFR, EST NON AFRICAN AMERICAN: 81 mL/min — AB (ref >90)
Glucose, Bld: 174 mg/dL — ABNORMAL HIGH (ref 70–99)
Potassium: 4.1 mEq/L (ref 3.7–5.3)
SODIUM: 139 meq/L (ref 137–147)

## 2013-03-28 LAB — CBC
HCT: 34.3 % — ABNORMAL LOW (ref 39.0–52.0)
Hemoglobin: 10.6 g/dL — ABNORMAL LOW (ref 13.0–17.0)
MCH: 24.3 pg — ABNORMAL LOW (ref 26.0–34.0)
MCHC: 30.9 g/dL (ref 30.0–36.0)
MCV: 78.7 fL (ref 78.0–100.0)
PLATELETS: 148 10*3/uL — AB (ref 150–400)
RBC: 4.36 MIL/uL (ref 4.22–5.81)
RDW: 19.3 % — ABNORMAL HIGH (ref 11.5–15.5)
WBC: 14 10*3/uL — AB (ref 4.0–10.5)

## 2013-03-28 LAB — INFLUENZA PANEL BY PCR (TYPE A & B)
H1N1 flu by pcr: NOT DETECTED
INFLAPCR: NEGATIVE
Influenza B By PCR: NEGATIVE

## 2013-03-28 MED ORDER — LEVOFLOXACIN IN D5W 750 MG/150ML IV SOLN
750.0000 mg | INTRAVENOUS | Status: DC
  Administered 2013-03-28: 750 mg via INTRAVENOUS
  Filled 2013-03-28 (×2): qty 150

## 2013-03-28 NOTE — Progress Notes (Signed)
UR completed. Patient changed to inpatient r/t requiring IV antibiotics and IV solumedrol  

## 2013-03-28 NOTE — Progress Notes (Signed)
ANTIBIOTIC CONSULT NOTE - INITIAL  Pharmacy Consult for Levaquin Indication: rule out pneumonia  No Known Allergies  Patient Measurements: Height: 5\' 6"  (167.6 cm) Weight: 131 lb 12.8 oz (59.784 kg) IBW/kg (Calculated) : 63.8   Vital Signs: Temp: 97.6 F (36.4 C) (01/07 0008) Temp src: Oral (01/07 0008) BP: 140/69 mmHg (01/07 0008) Pulse Rate: 100 (01/07 0008) Intake/Output from previous day:   Intake/Output from this shift:    Labs:  Recent Labs  03/27/13 1820  WBC 17.6*  HGB 11.0*  PLT 161  CREATININE 0.86   Estimated Creatinine Clearance: 54.1 ml/min (by C-G formula based on Cr of 0.86). No results found for this basename: VANCOTROUGH, VANCOPEAK, VANCORANDOM, GENTTROUGH, GENTPEAK, GENTRANDOM, TOBRATROUGH, TOBRAPEAK, TOBRARND, AMIKACINPEAK, AMIKACINTROU, AMIKACIN,  in the last 72 hours   Microbiology: No results found for this or any previous visit (from the past 720 hour(s)).  Medical History: Past Medical History  Diagnosis Date  . Atrial fibrillation   . Diagnosis unknown     lytic lesions-spine-biopsy negative?  . Prostate cancer   . Tricuspid regurgitation     pulmonary hypertension and mitral regurgitation  . Anemia, iron deficiency     f/u by hematology  . Measles   . Chronic obstructive pulmonary emphysema   . Fibrous dysplasia of bone     Bone scan Sept '11 - no mets; skeletal survey Sept '11  . Chronic diastolic CHF (congestive heart failure)     a. Echo 2/12: Mild LVH, EF 55%, mild MR, mild BAE, moderate TR, PASP 56, small effusion  . MGUS (monoclonal gammopathy of unknown significance)     Medications:  Scheduled:  . aspirin EC  81 mg Oral Daily  . diltiazem  120 mg Oral Daily  . finasteride  5 mg Oral Daily  . furosemide  40 mg Oral Daily  . gabapentin  300 mg Oral Daily  . ipratropium-albuterol  3 mL Nebulization Q6H  . levofloxacin (LEVAQUIN) IV  750 mg Intravenous Q24H  . methylPREDNISolone (SOLU-MEDROL) injection  80 mg  Intravenous Q12H  . sodium chloride  3 mL Intravenous Q12H  . sodium chloride  3 mL Intravenous Q12H  . tamsulosin  0.4 mg Oral Daily   Infusions:   Assessment: 80 yo admitted with SOB, wheezing.  MD ordering Levaquin for possible PNA/COPD exacerbation.  Goal of Therapy:  Treat infection  Plan:   Levaquin 750mg  IV q24h  F/U SCr/cultures as needed  Lawana Pai R 03/28/2013,12:36 AM

## 2013-03-28 NOTE — Progress Notes (Signed)
[facility] PROGRESS NOTE  [name] SWF:[id number] DOB: June 25, 1926 DOA: 03/27/2013 PCP: [name], MD  Assessment/Plan: Principal Problem:   COPD exacerbation - Will continue current regimen: Patient currently on Levaquin, Solu-Medrol and 20 nebs - Supportive therapy  Active Problems:   Atrial fibrillation - Continue diltiazem and aspirin    Heart failure, diastolic, chronic Compensated currently we'll continue home regimen    Hypertension Relatively well controlled on diltiazem   Code Status: full Family Communication: No family at bedside. Disposition Plan: Pending continued improvement in his respiratory status   Consultants:  None  Procedures:  None  Antibiotics:  Levaquin  HPI/Subjective: Patient has no new complaints currently feeling better than yesterday.  Objective: Filed Vitals:   03/28/13 1328  BP: 123/51  Pulse: 96  Temp: 97.8 F (36.6 C)  Resp: 22    Intake/Output Summary (Last 24 hours) at 03/28/13 1722 Last data filed at 03/28/13 1308  Gross per 24 hour  Intake    720 ml  Output      0 ml  Net    720 ml   Filed Weights   03/28/13 0008  Weight: 59.784 kg (131 lb 12.8 oz)    Exam:   General:  Patient in no acute distress, alert and awake  Cardiovascular: Irregularly irregular, no murmurs  Respiratory: Sitting up in bed, on room air, breath sounds bilaterally, mild expiratory wheeze  Abdomen: Soft, nondistended, nontender  Musculoskeletal: No cyanosis or clubbing  Data Reviewed: Basic Metabolic Panel:  Recent Labs Lab 03/27/13 1820 03/28/13 0455  NA 141 139  K 4.2 4.1  CL 102 100  CO2 24 23  GLUCOSE 160* 174*  BUN 18 18  CREATININE 0.86 0.77  CALCIUM 9.2 8.9   Liver Function Tests: No results found for this basename: AST, ALT, ALKPHOS, BILITOT, PROT, ALBUMIN,  in the last 168 hours No results found for this basename: LIPASE, AMYLASE,  in the last 168 hours No results found for this basename:  AMMONIA,  in the last 168 hours CBC:  Recent Labs Lab 03/27/13 1820 03/28/13 0455  WBC 17.6* 14.0*  HGB 11.0* 10.6*  HCT 34.6* 34.3*  MCV 78.3 78.7  PLT 161 148*   Cardiac Enzymes: No results found for this basename: CKTOTAL, CKMB, CKMBINDEX, TROPONINI,  in the last 168 hours BNP (last 3 results)  Recent Labs  10/30/12 0940 11/01/12 0445 03/27/13 1820  PROBNP 528.9* 1610.0* 981.4*   CBG: No results found for this basename: GLUCAP,  in the last 168 hours  No results found for this or any previous visit (from the past 240 hour(s)).   Studies: Dg Chest 2 View (if Patient Has Fever And/or Copd)  03/27/2013   CLINICAL DATA:  Dyspnea and cough.  EXAM: CHEST  2 VIEW  COMPARISON:  November 01, 2012  FINDINGS: The lungs are hyperinflated. The interstitial markings remain increased bilaterally. There is persistent blunting of the left lateral costophrenic gutter. The cardiac silhouette is normal in size. There is tortuosity of the descending thoracic aorta. Chronic deformity of the lateral aspect of the right 6th or 7th rib is present. There are degenerative changes of both shoulders.  IMPRESSION: There is hyperinflation consistent with COPD. There is likely underlying chronic fibrotic change. I cannot exclude superimposed acute bronchitis. There is no alveolar pneumonia nor evidence of CHF.   Electronically Signed   By: David  [place]   On: 03/27/2013 18:13    Scheduled Meds: . aspirin EC  81 mg Oral Daily  .  diltiazem  120 mg Oral Daily  . finasteride  5 mg Oral Daily  . furosemide  40 mg Oral Daily  . gabapentin  300 mg Oral Daily  . ipratropium-albuterol  3 mL Nebulization Q6H  . levofloxacin (LEVAQUIN) IV  750 mg Intravenous Q24H  . methylPREDNISolone (SOLU-MEDROL) injection  80 mg Intravenous Q12H  . sodium chloride  3 mL Intravenous Q12H  . sodium chloride  3 mL Intravenous Q12H  . tamsulosin  0.4 mg Oral Daily   Continuous Infusions:   Principal Problem:   COPD  exacerbation Active Problems:   Atrial fibrillation   Heart failure, diastolic, chronic   Hypertension   SOB (shortness of breath)    Time spent: > 35 minutes    Velvet Bathe  [facility] Pager [phone]. If 7PM-7AM, please contact night-coverage at [email or web address], password [facility] 03/28/2013, 5:22 PM  LOS: 1 day

## 2013-03-29 DIAGNOSIS — N400 Enlarged prostate without lower urinary tract symptoms: Secondary | ICD-10-CM

## 2013-03-29 MED ORDER — PREDNISONE 50 MG PO TABS: 50.0000 mg | ORAL_TABLET | Freq: Every day | ORAL | Status: DC

## 2013-03-29 MED ORDER — LEVOFLOXACIN 750 MG PO TABS: 750.0000 mg | ORAL_TABLET | Freq: Every day | ORAL | Status: DC

## 2013-03-29 NOTE — Discharge Summary (Signed)
Physician Discharge Summary  [name]:[id number] DOB: [birth year] DOA: 03/27/2013  PCP: [name], MD  Admit date: 03/27/2013 Discharge date: 03/29/2013  Time spent: > 35 minutes  Recommendations for Outpatient Follow-up:  1. Please be sure to follow up with your primary care physician in 1-2 weeks or sooner should any new concerns arise. 2. Continue to take your prednisone and levaquin as instructed 3. Avoid any tobacco/nicotine  Discharge Diagnoses:  Principal Problem:   COPD exacerbation Active Problems:   Atrial fibrillation   Heart failure, diastolic, chronic   Hypertension   SOB (shortness of breath)   Discharge Condition: Stable  Diet recommendation: Low sodium heart healthy  Filed Weights   03/28/13 0008 03/29/13 0500  Weight: 59.784 kg (131 lb 12.8 oz) 62.37 kg (137 lb 8 oz)    History of present illness:  Pt is an 80 y/o with history of COPD who presented to the ED complaining of SOB.  Hospital Course:  Principal Problem:  COPD exacerbation  - Will continue Levaquin and Prednisone for 5 day total - Supportive therapy   Active Problems:   Atrial fibrillation  - Continue diltiazem and aspirin  Heart failure, diastolic, chronic  Compensated currently we'll continue home regimen   Hypertension  Relatively well controlled on diltiazem  Procedures:  None  Consultations:  None  Discharge Exam: Filed Vitals:   03/29/13 1429  BP: 128/59  Pulse: 91  Temp: 97.7 F (36.5 C)  Resp: 20    General: Pt in NAD, Alert and Awake Cardiovascular: RRR, no MRG Respiratory: prolonged expiratory phase, no rhales. Breathing comfortably on room air.  Discharge Instructions  Discharge Orders   Future Appointments Provider Department Dept Phone   04/03/2013 3:00 PM Neena Rhymes, MD [facility] [place] [phone]   04/12/2013 1:00 PM [place] [phone]   04/12/2013 1:30 PM  [place] [phone]   05/07/2013 1:00 PM Neena Rhymes, MD [facility] [place] [phone]   05/10/2013 1:00 PM [facility] [place] [phone]   05/10/2013 1:30 PM [place] [phone]   06/07/2013 10:00 AM [facility] [place] [phone]   06/07/2013 10:30 AM Wyatt Portela, MD [place] [phone]   Future Orders Complete By Expires   Call MD for:  difficulty breathing, headache or visual disturbances  As directed    Call MD for:  extreme fatigue  As directed    Call MD for:  temperature >100.4  As directed    Diet - low sodium heart healthy  As directed    Increase activity slowly  As directed        Medication List    STOP taking these medications       predniSONE 5 MG Tabs tablet  Commonly known as:  STERAPRED UNI-PAK  Replaced by:  predniSONE 50 MG tablet      TAKE these medications       AEROCHAMBER PLUS FLO-VU MEDIUM Misc  1 each by Other route once.     albuterol 108 (90 BASE) MCG/ACT inhaler  Commonly known as:  PROVENTIL HFA;VENTOLIN HFA  Inhale 1-3 puffs into the lungs every 4 (four) hours as needed for wheezing or shortness of breath.     alendronate 70 MG tablet  Commonly known as:  FOSAMAX  Take 70 mg by mouth every 7 (seven) days. Take with a full glass of water on an empty stomach.     aspirin EC 81 MG tablet  Take 81 mg by mouth daily.     diltiazem 120 MG 24 hr capsule  Commonly known as:  CARDIZEM CD  Take 120 mg by mouth daily.     finasteride 5 MG tablet  Commonly known as:  PROSCAR  Take 5 mg by mouth daily.     fluticasone-salmeterol 115-21 MCG/ACT inhaler  Commonly known as:  ADVAIR HFA  Inhale 2 puffs into the lungs 2 (two) times daily.     furosemide 40 MG tablet  Commonly known as:  LASIX   Take 40 mg by mouth daily.     gabapentin 300 MG capsule  Commonly known as:  NEURONTIN  Take 300 mg by mouth daily.     ipratropium-albuterol 0.5-2.5 (3) MG/3ML Soln  Commonly known as:  DUONEB  Take 3 mLs by nebulization every 6 (six) hours as needed (shortness of breath and wheezing).     levofloxacin 750 MG tablet  Commonly known as:  LEVAQUIN  Take 1 tablet (750 mg total) by mouth daily.     predniSONE 50 MG tablet  Commonly known as:  DELTASONE  Take 1 tablet (50 mg total) by mouth daily with breakfast.     tamsulosin 0.4 MG Caps capsule  Commonly known as:  FLOMAX  Take 0.4 mg by mouth daily.       No Known Allergies     Follow-up Information   Follow up with [name], MD. (follow up PCP appointment 04/03/13 at 3 pm)    Specialty:  Internal Medicine   Contact information:   [address] [place] [address] [phone]        The results of significant diagnostics from this hospitalization (including imaging, microbiology, ancillary and laboratory) are listed below for reference.    Significant Diagnostic Studies: Dg Chest 2 View (if Patient Has Fever And/or Copd)  03/27/2013   CLINICAL DATA:  Dyspnea and cough.  EXAM: CHEST  2 VIEW  COMPARISON:  November 01, 2012  FINDINGS: The lungs are hyperinflated. The interstitial markings remain increased bilaterally. There is persistent blunting of the left lateral costophrenic gutter. The cardiac silhouette is normal in size. There is tortuosity of the descending thoracic aorta. Chronic deformity of the lateral aspect of the right 6th or 7th rib is present. There are degenerative changes of both shoulders.  IMPRESSION: There is hyperinflation consistent with COPD. There is likely underlying chronic fibrotic change. I cannot exclude superimposed acute bronchitis. There is no alveolar pneumonia nor evidence of CHF.   Electronically Signed   By: David  [place]   On: 03/27/2013 18:13    Microbiology: No results  found for this or any previous visit (from the past 240 hour(s)).   Labs: Basic Metabolic Panel:  Recent Labs Lab 03/27/13 1820 03/28/13 0455  NA 141 139  K 4.2 4.1  CL 102 100  CO2 24 23  GLUCOSE 160* 174*  BUN 18 18  CREATININE 0.86 0.77  CALCIUM 9.2 8.9   Liver Function Tests: No results found for this basename: AST, ALT, ALKPHOS, BILITOT, PROT, ALBUMIN,  in the last 168 hours No results found for this basename: LIPASE, AMYLASE,  in the last 168 hours No results found for this basename: AMMONIA,  in the last 168 hours CBC:  Recent Labs Lab 03/27/13 1820 03/28/13 0455  WBC  17.6* 14.0*  HGB 11.0* 10.6*  HCT 34.6* 34.3*  MCV 78.3 78.7  PLT 161 148*   Cardiac Enzymes: No results found for this basename: CKTOTAL, CKMB, CKMBINDEX, TROPONINI,  in the last 168 hours BNP: BNP (last 3 results)  Recent Labs  10/30/12 0940 11/01/12 0445 03/27/13 1820  PROBNP 528.9* 1610.0* 981.4*   CBG: No results found for this basename: GLUCAP,  in the last 168 hours     Signed:  Velvet Bathe  [facility] 03/29/2013, 3:22 PM

## 2013-03-29 NOTE — Progress Notes (Signed)
[name] active with [facility] services. Community Care Coordinator NP has been following patient closely for disease management. Patient can also benefit from home health services as well. Spoke with [name] who gave this Probation officer permission to call grandson, [name], at [phone]. [name] is patient's HCPOA per patient. Agreeable to follow up appointment being made on patient's behalf. Follow up appointment made with Dr Linda Hedges for 04/03/13 at 3 pm. Left this information on [name]'s voicemail and noted on discharge follow up appointments as well. [place] Coordinator, Nurse Practitioner who follows patient in the community expresses concerns regarding patient needing goals of care and anti-anxiety medicine. [name] also could benefit from home health services as well. Patient aware that [facility] to follow post hospital discharge. Spoke with inpatient RNCM to make aware [facility] [facility] following. Patient also is COPD GOLD patient.

## 2013-03-29 NOTE — Progress Notes (Signed)
Spoke with grandson Engineer, materials via telephone, he selected [facility] for [facility]. Referral given to in house rep.

## 2013-04-02 ENCOUNTER — Telehealth: Payer: Self-pay

## 2013-04-02 NOTE — Telephone Encounter (Signed)
Phone call from [place] practioner with [facility] [phone] (Friday at 2:53) states she is in the home with the patient and he seemed to be in moderate respiratory distress. He was given nebulizer treatments and Lorazepam 1 mg . Family stayed with him over the weekend. She requests a referral for pallative/hospice consult.

## 2013-04-03 ENCOUNTER — Encounter: Payer: Self-pay | Admitting: Internal Medicine

## 2013-04-03 ENCOUNTER — Telehealth: Payer: Self-pay | Admitting: *Deleted

## 2013-04-03 ENCOUNTER — Ambulatory Visit (INDEPENDENT_AMBULATORY_CARE_PROVIDER_SITE_OTHER): Payer: PRIVATE HEALTH INSURANCE | Admitting: Internal Medicine

## 2013-04-03 VITALS — BP 102/60 | HR 109 | Temp 97.4°F | Wt 135.8 lb

## 2013-04-03 DIAGNOSIS — M8560 Other cyst of bone, unspecified site: Secondary | ICD-10-CM

## 2013-04-03 DIAGNOSIS — E43 Unspecified severe protein-calorie malnutrition: Secondary | ICD-10-CM

## 2013-04-03 DIAGNOSIS — Z7189 Other specified counseling: Secondary | ICD-10-CM

## 2013-04-03 DIAGNOSIS — M8500 Fibrous dysplasia (monostotic), unspecified site: Secondary | ICD-10-CM

## 2013-04-03 DIAGNOSIS — J438 Other emphysema: Secondary | ICD-10-CM

## 2013-04-03 DIAGNOSIS — I5032 Chronic diastolic (congestive) heart failure: Secondary | ICD-10-CM

## 2013-04-03 MED ORDER — FLUTICASONE-SALMETEROL 230-21 MCG/ACT IN AERO: 2.0000 | INHALATION_SPRAY | Freq: Two times a day (BID) | RESPIRATORY_TRACT | Status: DC
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
## 2013-04-03 NOTE — Patient Instructions (Signed)
It is good to see you. You are a rich man - love and family  Lungs - pretty clear today. Please continue to use the duonebs 4 times a day, the advair at an increased strength 2 puffs twice a day, the ProAir 2 puffs every 4 hours as need but if you need it more than 3 times a day you need to call. It is ok to take the lorazepam.  [facility] and [facility] - the goal is to keep you at home, keep you comfortable and to sure that anything we do is to help you and not cause undue pain and suffering. This is a Retail [facility] and they will provide a lot of care and comfort, including the possibility of oxygen for comfort care.   Will have you come back as needed or if it is a strain we can make housecalls.

## 2013-04-03 NOTE — Progress Notes (Signed)
Pre visit review using our clinic review tool, if applicable. No additional management support is needed unless otherwise documented below in the visit note. 

## 2013-04-03 NOTE — Telephone Encounter (Signed)
Ms. Royce Macadamia phoned regarding patient, but did not leave any further information.  Attempted to return her call, with no answer. Candelaria Arenas on voicemail

## 2013-04-07 NOTE — Assessment & Plan Note (Signed)
MOST for executed Jan '15, Out of facility order done. DNR/DNI, NO TUBE FEEDING, NO HD, SHORT-TERM IVF FOR COMFORT. RETURN TO HOSPITAL FOR COMFORT CARE. Reviewed and discussed with patient and family. He will accept in-home hospice and palliative care.

## 2013-04-11 ENCOUNTER — Encounter: Payer: Self-pay | Admitting: Medical Oncology

## 2013-04-12 ENCOUNTER — Other Ambulatory Visit (HOSPITAL_BASED_OUTPATIENT_CLINIC_OR_DEPARTMENT_OTHER): Payer: PRIVATE HEALTH INSURANCE

## 2013-04-12 ENCOUNTER — Ambulatory Visit: Payer: Medicare Other

## 2013-04-12 ENCOUNTER — Other Ambulatory Visit: Payer: Self-pay | Admitting: Internal Medicine

## 2013-04-12 DIAGNOSIS — C61 Malignant neoplasm of prostate: Secondary | ICD-10-CM

## 2013-04-12 DIAGNOSIS — D649 Anemia, unspecified: Secondary | ICD-10-CM

## 2013-04-12 LAB — CBC WITH DIFFERENTIAL/PLATELET
BASO%: 0.1 % (ref 0.0–2.0)
BASOS ABS: 0 10*3/uL (ref 0.0–0.1)
EOS ABS: 0.1 10*3/uL (ref 0.0–0.5)
EOS%: 0.9 % (ref 0.0–7.0)
HCT: 36.6 % — ABNORMAL LOW (ref 38.4–49.9)
HEMOGLOBIN: 11.4 g/dL — AB (ref 13.0–17.1)
LYMPH%: 11.2 % — ABNORMAL LOW (ref 14.0–49.0)
MCH: 24.5 pg — ABNORMAL LOW (ref 27.2–33.4)
MCHC: 31.1 g/dL — ABNORMAL LOW (ref 32.0–36.0)
MCV: 78.7 fL — AB (ref 79.3–98.0)
MONO#: 0.4 10*3/uL (ref 0.1–0.9)
MONO%: 4.3 % (ref 0.0–14.0)
NEUT#: 7.8 10*3/uL — ABNORMAL HIGH (ref 1.5–6.5)
NEUT%: 83.5 % — ABNORMAL HIGH (ref 39.0–75.0)
Platelets: 101 10*3/uL — ABNORMAL LOW (ref 140–400)
RBC: 4.65 10*6/uL (ref 4.20–5.82)
RDW: 20.6 % — AB (ref 11.0–14.6)
WBC: 9.3 10*3/uL (ref 4.0–10.3)
lymph#: 1 10*3/uL (ref 0.9–3.3)

## 2013-04-12 MED ORDER — DARBEPOETIN ALFA-POLYSORBATE 300 MCG/0.6ML IJ SOLN: 300.0000 ug | Freq: Once | INTRAMUSCULAR | Status: DC

## 2013-04-13 DIAGNOSIS — I4891 Unspecified atrial fibrillation: Secondary | ICD-10-CM

## 2013-04-13 DIAGNOSIS — I509 Heart failure, unspecified: Secondary | ICD-10-CM

## 2013-04-13 DIAGNOSIS — J441 Chronic obstructive pulmonary disease with (acute) exacerbation: Secondary | ICD-10-CM

## 2013-04-13 DIAGNOSIS — I5032 Chronic diastolic (congestive) heart failure: Secondary | ICD-10-CM

## 2013-04-18 ENCOUNTER — Telehealth: Payer: Self-pay | Admitting: Internal Medicine

## 2013-04-18 NOTE — Telephone Encounter (Signed)
Relevant patient education mailed to patient.  

## 2013-05-07 ENCOUNTER — Ambulatory Visit (INDEPENDENT_AMBULATORY_CARE_PROVIDER_SITE_OTHER): Admitting: Internal Medicine

## 2013-05-07 ENCOUNTER — Encounter: Payer: Self-pay | Admitting: Internal Medicine

## 2013-05-07 VITALS — BP 90/52 | HR 113 | Temp 97.4°F | Wt 129.4 lb

## 2013-05-07 DIAGNOSIS — I5032 Chronic diastolic (congestive) heart failure: Secondary | ICD-10-CM

## 2013-05-07 DIAGNOSIS — Z7189 Other specified counseling: Secondary | ICD-10-CM

## 2013-05-07 DIAGNOSIS — J438 Other emphysema: Secondary | ICD-10-CM

## 2013-05-07 DIAGNOSIS — I10 Essential (primary) hypertension: Secondary | ICD-10-CM

## 2013-05-07 NOTE — Assessment & Plan Note (Signed)
Not on oxygen at time of visit - RA O2 sat 96%  Plan Continue present medication for rescue  Continue nocturnal oxygen

## 2013-05-07 NOTE — Progress Notes (Signed)
Pre visit review using our clinic review tool, if applicable. No additional management support is needed unless otherwise documented below in the visit note. 
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
## 2013-05-07 NOTE — Patient Instructions (Addendum)
It has been a pleasure providing care for you today.  1) Lungs - pretty clear today. I'm glad to hear that the combination of oxygen, lorazepam, and morphine have helped you feel better.   2) [facility]osp[facility] and [facility] - the goal is to keep you at home, keep you comfortable and to sure that anything we do is to help you and not cause undue pain and suffering. I am glad to hear that hospice has provided a lot of care and comfort, and that oxygen has been so effective for you.   3) Come back as needed, or if it is a strain we can make housecalls.

## 2013-05-07 NOTE — Assessment & Plan Note (Addendum)
BP Readings from Last 3 Encounters:  05/07/13 90/52  04/03/13 102/60  03/29/13 128/59   Trending down, may be due to Morphine sulfate.   Plan Continue to monitor  May need to adjust medication for continued drop

## 2013-05-07 NOTE — Assessment & Plan Note (Signed)
No evidence of decompensation on presentation

## 2013-05-07 NOTE — Assessment & Plan Note (Signed)
Lungs are pretty clear today. The combination of oxygen, lorazepam, and morphine have helped patient feel and breathe better. Patient is now working with Hospice and [facility]. Patient reports that he has received a lot of care and comfort, and that oxygen and morphine has helped with dyspnea.   Plan Continue hospice and palliative care. Continue to focus on comfort and enjoying life.

## 2013-05-09 ENCOUNTER — Telehealth: Payer: Self-pay

## 2013-05-09 NOTE — Telephone Encounter (Signed)
Message copied by Shelly Coss on Wed May 09, 2013 10:23 AM ------      Message from: Neena Rhymes      Created: Tue May 08, 2013 12:01 PM       Please fax a copy of this note to [facility]. thanks ------

## 2013-05-09 NOTE — Telephone Encounter (Signed)
Copy has been faxed to [phone]

## 2013-05-10 ENCOUNTER — Ambulatory Visit (HOSPITAL_BASED_OUTPATIENT_CLINIC_OR_DEPARTMENT_OTHER): Payer: Medicare Other

## 2013-05-10 ENCOUNTER — Other Ambulatory Visit (HOSPITAL_BASED_OUTPATIENT_CLINIC_OR_DEPARTMENT_OTHER): Payer: Medicare Other

## 2013-05-10 VITALS — BP 104/56 | HR 97 | Temp 98.0°F

## 2013-05-10 DIAGNOSIS — N189 Chronic kidney disease, unspecified: Secondary | ICD-10-CM

## 2013-05-10 DIAGNOSIS — C61 Malignant neoplasm of prostate: Secondary | ICD-10-CM

## 2013-05-10 DIAGNOSIS — D649 Anemia, unspecified: Secondary | ICD-10-CM

## 2013-05-10 DIAGNOSIS — D638 Anemia in other chronic diseases classified elsewhere: Secondary | ICD-10-CM

## 2013-05-10 LAB — CBC WITH DIFFERENTIAL/PLATELET
BASO%: 0.6 % (ref 0.0–2.0)
BASOS ABS: 0.1 10*3/uL (ref 0.0–0.1)
EOS ABS: 0.1 10*3/uL (ref 0.0–0.5)
EOS%: 1.4 % (ref 0.0–7.0)
HEMATOCRIT: 31 % — AB (ref 38.4–49.9)
HGB: 9.8 g/dL — ABNORMAL LOW (ref 13.0–17.1)
LYMPH%: 12 % — AB (ref 14.0–49.0)
MCH: 24.2 pg — ABNORMAL LOW (ref 27.2–33.4)
MCHC: 31.8 g/dL — ABNORMAL LOW (ref 32.0–36.0)
MCV: 76.1 fL — AB (ref 79.3–98.0)
MONO#: 0.7 10*3/uL (ref 0.1–0.9)
MONO%: 7.6 % (ref 0.0–14.0)
NEUT%: 78.4 % — AB (ref 39.0–75.0)
NEUTROS ABS: 6.7 10*3/uL — AB (ref 1.5–6.5)
PLATELETS: 135 10*3/uL — AB (ref 140–400)
RBC: 4.08 10*6/uL — ABNORMAL LOW (ref 4.20–5.82)
RDW: 20.7 % — ABNORMAL HIGH (ref 11.0–14.6)
WBC: 8.6 10*3/uL (ref 4.0–10.3)
lymph#: 1 10*3/uL (ref 0.9–3.3)

## 2013-05-10 MED ORDER — DARBEPOETIN ALFA-POLYSORBATE 300 MCG/0.6ML IJ SOLN
300.0000 ug | Freq: Once | INTRAMUSCULAR | Status: AC
  Administered 2013-05-10: 300 ug via SUBCUTANEOUS
  Filled 2013-05-10: qty 0.6

## 2013-05-24 ENCOUNTER — Telehealth: Payer: Self-pay | Admitting: *Deleted

## 2013-05-24 MED ORDER — TAMSULOSIN HCL 0.4 MG PO CAPS: 0.4000 mg | ORAL_CAPSULE | Freq: Every day | ORAL | Status: AC

## 2013-05-24 NOTE — Telephone Encounter (Signed)
Antony Haste phoned stating patient needed refill on flomax.  Refilled per protocol and notified Antony Haste

## 2013-06-07 ENCOUNTER — Other Ambulatory Visit (HOSPITAL_BASED_OUTPATIENT_CLINIC_OR_DEPARTMENT_OTHER): Payer: Medicare Other

## 2013-06-07 ENCOUNTER — Telehealth: Payer: Self-pay | Admitting: Oncology

## 2013-06-07 ENCOUNTER — Ambulatory Visit (HOSPITAL_BASED_OUTPATIENT_CLINIC_OR_DEPARTMENT_OTHER): Admitting: Oncology

## 2013-06-07 ENCOUNTER — Encounter: Payer: Self-pay | Admitting: Oncology

## 2013-06-07 VITALS — BP 103/51 | HR 101 | Temp 97.7°F | Resp 20 | Ht 66.0 in | Wt 132.0 lb

## 2013-06-07 DIAGNOSIS — D649 Anemia, unspecified: Secondary | ICD-10-CM

## 2013-06-07 DIAGNOSIS — N039 Chronic nephritic syndrome with unspecified morphologic changes: Secondary | ICD-10-CM

## 2013-06-07 DIAGNOSIS — D638 Anemia in other chronic diseases classified elsewhere: Secondary | ICD-10-CM

## 2013-06-07 DIAGNOSIS — D631 Anemia in chronic kidney disease: Secondary | ICD-10-CM

## 2013-06-07 DIAGNOSIS — N189 Chronic kidney disease, unspecified: Secondary | ICD-10-CM

## 2013-06-07 DIAGNOSIS — Z8546 Personal history of malignant neoplasm of prostate: Secondary | ICD-10-CM

## 2013-06-07 DIAGNOSIS — C61 Malignant neoplasm of prostate: Secondary | ICD-10-CM

## 2013-06-07 LAB — COMPREHENSIVE METABOLIC PANEL (CC13)
ALK PHOS: 53 U/L (ref 40–150)
AST: 11 U/L (ref 5–34)
Albumin: 3 g/dL — ABNORMAL LOW (ref 3.5–5.0)
Anion Gap: 10 mEq/L (ref 3–11)
BILIRUBIN TOTAL: 0.24 mg/dL (ref 0.20–1.20)
BUN: 19 mg/dL (ref 7.0–26.0)
CO2: 28 mEq/L (ref 22–29)
Calcium: 9.2 mg/dL (ref 8.4–10.4)
Chloride: 105 mEq/L (ref 98–109)
Creatinine: 0.8 mg/dL (ref 0.7–1.3)
Glucose: 121 mg/dl (ref 70–140)
Potassium: 4.1 mEq/L (ref 3.5–5.1)
SODIUM: 143 meq/L (ref 136–145)
TOTAL PROTEIN: 6.9 g/dL (ref 6.4–8.3)

## 2013-06-07 LAB — CBC WITH DIFFERENTIAL/PLATELET
BASO%: 0.2 % (ref 0.0–2.0)
Basophils Absolute: 0 10*3/uL (ref 0.0–0.1)
EOS%: 2.1 % (ref 0.0–7.0)
Eosinophils Absolute: 0.1 10*3/uL (ref 0.0–0.5)
HCT: 29.5 % — ABNORMAL LOW (ref 38.4–49.9)
HGB: 9.1 g/dL — ABNORMAL LOW (ref 13.0–17.1)
LYMPH%: 15.6 % (ref 14.0–49.0)
MCH: 24 pg — AB (ref 27.2–33.4)
MCHC: 30.8 g/dL — AB (ref 32.0–36.0)
MCV: 77.8 fL — AB (ref 79.3–98.0)
MONO#: 0.4 10*3/uL (ref 0.1–0.9)
MONO%: 5.6 % (ref 0.0–14.0)
NEUT#: 4.7 10*3/uL (ref 1.5–6.5)
NEUT%: 76.5 % — ABNORMAL HIGH (ref 39.0–75.0)
PLATELETS: 163 10*3/uL (ref 140–400)
RBC: 3.79 10*6/uL — AB (ref 4.20–5.82)
RDW: 19.2 % — ABNORMAL HIGH (ref 11.0–14.6)
WBC: 6.2 10*3/uL (ref 4.0–10.3)
lymph#: 1 10*3/uL (ref 0.9–3.3)

## 2013-06-07 LAB — IRON AND TIBC CHCC
%SAT: 19 % — ABNORMAL LOW (ref 20–55)
Iron: 34 ug/dL — ABNORMAL LOW (ref 42–163)
TIBC: 180 ug/dL — AB (ref 202–409)
UIBC: 146 ug/dL (ref 117–376)

## 2013-06-07 LAB — FERRITIN CHCC: FERRITIN: 331 ng/mL — AB (ref 22–316)

## 2013-06-07 NOTE — Progress Notes (Signed)
Hematology and Oncology Follow Up Visit  [name] [id number] June 16, 1926 80 y.o. 06/07/2013 10:39 AM [name], MDNorins, [name], MD   Principle Diagnosis: 80 year old with:  1. Multifactorial anemia, likely due to chronic disease. Diagnosed in 2011. He was under the care of Dr. Georgiann Cocker in [place].  2. IgA MGUS 3. History of prostate cancer.    Prior Therapy: S/P bone marrow biopsy in 10/2009. It did not show MDS or plasma cell disorder. Received Feraheme 510 mg IV on 05/18/12.  Current therapy: Aransep 300 mcg every  Four weeks to keep hemoglobin above 11.   Interim History:  [name] is a an 80 year old man who returns for a followup visit by himself. Since his last visit, he has been feeling better and have regained most activities of daily living. He is breathing without any difficulties today and not reporting any chest pain or shortness of breath. He denies fevers or difficulty breathing. No other complications noted related to Aranesp or anemia. He is not reporting any weakness fatigue or tiredness. Is not reported any recent hospitalizations since his last summer. That report any headaches blurred vision double vision. Does not report any hematochezia or melena. Report any frequency urgency or hesitancy. He has not reported any epistaxis or any blood loss. Has not reported any lymphadenopathy or petechiae or any skin changes. Does not report any chest pain orthopnea or leg edema.   Medications: I have reviewed the patient's current medications.  Current Outpatient Prescriptions  Medication Sig Dispense Refill  . albuterol (PROVENTIL HFA;VENTOLIN HFA) 108 (90 BASE) MCG/ACT inhaler Inhale 1-3 puffs into the lungs every 4 (four) hours as needed for wheezing or shortness of breath.      [name] aspirin EC 81 MG tablet Take 81 mg by mouth daily.      [name] diltiazem (CARDIZEM CD) 120 MG 24 hr capsule Take 120 mg by mouth daily.      . finasteride (PROSCAR) 5 MG tablet Take 5 mg  by mouth daily.      . fluticasone-salmeterol (ADVAIR HFA) 230-21 MCG/ACT inhaler Inhale 2 puffs into the lungs 2 (two) times daily.  1 Inhaler  12  . furosemide (LASIX) 40 MG tablet Take 40 mg by mouth daily.      [name] gabapentin (NEURONTIN) 300 MG capsule Take 300 mg by mouth daily.      [name] ipratropium-albuterol (DUONEB) 0.5-2.5 (3) MG/3ML SOLN Take 3 mLs by nebulization every 6 (six) hours as needed (shortness of breath and wheezing).      . LORazepam (ATIVAN) 0.5 MG tablet Take 0.5 mg by mouth 3 (three) times daily.      . MORPHINE SULFATE ER PO Take by mouth as needed.      [name] PROAIR HFA 108 (90 BASE) MCG/ACT inhaler USE AS DIRECTED  8.5 each  5  . Spacer/Aero-Holding Chambers (AEROCHAMBER PLUS FLO-VU MEDIUM) MISC 1 each by Other route once.  1 each  1  . tamsulosin (FLOMAX) 0.4 MG CAPS capsule Take 1 capsule (0.4 mg total) by mouth daily.  30 capsule  6   No current facility-administered medications for this visit.    Allergies: No Known Allergies  Past Medical History, Surgical history, Social history, and Family History were reviewed and updated.  Review of Systems:  Remaining ROS negative.  Physical Exam: Blood pressure 103/51, pulse 101, temperature 97.7 F (36.5 C), temperature source Oral, resp. rate 20, height 5' 6" (1.676 m), weight 132 lb (59.875 kg),  SpO2 100.00%. ECOG: 1 General appearance: alert Head: Normocephalic, without obvious abnormality, atraumatic Neck: no adenopathy, no carotid bruit, no JVD, supple, symmetrical, trachea midline and thyroid not enlarged, symmetric, no tenderness/mass/nodules Lymph nodes: Cervical, supraclavicular, and axillary nodes normal. Heart:regular rate and rhythm, S1, S2 normal, no murmur, click, rub or gallop Lung:chest clear, no wheezing, rales, normal symmetric air entry Abdomen: soft, non-tender, without masses or organomegaly EXT:no erythema, induration, or nodules   Lab Results: Lab Results  Component Value Date   WBC 6.2  06/07/2013   HGB 9.1* 06/07/2013   HCT 29.5* 06/07/2013   MCV 77.8* 06/07/2013   PLT 163 06/07/2013     Chemistry      Component Value Date/Time   NA 139 03/28/2013 0455   NA 142 04/11/2012 0950   K 4.1 03/28/2013 0455   K 3.9 04/11/2012 0950   CL 100 03/28/2013 0455   CL 105 04/11/2012 0950   CO2 23 03/28/2013 0455   CO2 29 04/11/2012 0950   BUN 18 03/28/2013 0455   BUN 20.0 04/11/2012 0950   CREATININE 0.77 03/28/2013 0455   CREATININE 0.9 04/11/2012 0950      Component Value Date/Time   CALCIUM 8.9 03/28/2013 0455   CALCIUM 9.1 04/11/2012 0950   ALKPHOS 49 10/31/2012 0403   ALKPHOS 60 04/11/2012 0950   AST 10 10/31/2012 0403   AST 12 04/11/2012 0950   ALT 6 10/31/2012 0403   ALT 7 04/11/2012 0950   BILITOT 0.3 10/31/2012 0403   BILITOT 0.37 04/11/2012 0950      Impression and Plan:  80 year old with the following issues:  1. Multifactorial anemia: He has an element of chronic disease, renal disease and possible B12 and iron deficiency.  S/P Feraheme on 05/18/12. His hemoglobin is down to 9.1 with an MCV of 77.8. I am checking his iron stores today and if he continues to be iron deficient I will set him up with IV iron infusion with Feraheme. Risks and benefits were discussed again and he is ready and willing to proceed. I will continue his Aransep on monthly basis to keep his Hgb above 11. He will receive an Aranesp injection after his iron stores have been repleted.  2. Elevated IgA in the past. I see no evidence of a a plasma cell disorder. He was found to have a benign bone lesion in the past. I will repeat serum protein electrophoresis with the next visit.  3. COPD: he is followd be Dr. Linda Hedges for that. He is status post recent hospitalization which seems to have stabilized  4. History of prostate cancer: Continue to be on active surveillance without any evidence of relapse.  5. Follow up: in 6 months.    SHADAD,FIRAS 3/19/201510:39 AM

## 2013-06-07 NOTE — Telephone Encounter (Signed)
gv and printed appt sched and avs for pt for March thru [facility].

## 2013-06-08 ENCOUNTER — Encounter: Payer: Self-pay | Admitting: Internal Medicine

## 2013-06-13 LAB — SPEP & IFE WITH QIG
ALBUMIN ELP: 47 % — AB (ref 55.8–66.1)
Alpha-1-Globulin: 6.7 % — ABNORMAL HIGH (ref 2.9–4.9)
Alpha-2-Globulin: 8.7 % (ref 7.1–11.8)
BETA 2: 12.4 % — AB (ref 3.2–6.5)
BETA GLOBULIN: 7.8 % — AB (ref 4.7–7.2)
Gamma Globulin: 17.4 % (ref 11.1–18.8)
IGA: 964 mg/dL — AB (ref 68–379)
IGG (IMMUNOGLOBIN G), SERUM: 1160 mg/dL (ref 650–1600)
IgM, Serum: <5 mg/dL — ABNORMAL LOW (ref 41–251)
TOTAL PROTEIN, SERUM ELECTROPHOR: 6.4 g/dL (ref 6.0–8.3)

## 2013-06-13 LAB — PSA: PSA: 0.36 ng/mL (ref <=4.00)

## 2013-06-14 ENCOUNTER — Ambulatory Visit (HOSPITAL_BASED_OUTPATIENT_CLINIC_OR_DEPARTMENT_OTHER): Payer: Medicare Other

## 2013-06-14 ENCOUNTER — Other Ambulatory Visit: Payer: Self-pay | Admitting: Oncology

## 2013-06-14 VITALS — BP 96/58 | HR 72 | Temp 98.0°F | Resp 18

## 2013-06-14 DIAGNOSIS — C61 Malignant neoplasm of prostate: Secondary | ICD-10-CM

## 2013-06-14 DIAGNOSIS — D509 Iron deficiency anemia, unspecified: Secondary | ICD-10-CM

## 2013-06-14 DIAGNOSIS — D649 Anemia, unspecified: Secondary | ICD-10-CM

## 2013-06-14 MED ORDER — SODIUM CHLORIDE 0.9 % IV SOLN
Freq: Once | INTRAVENOUS | Status: AC
  Administered 2013-06-14: 12:00:00 via INTRAVENOUS

## 2013-06-14 MED ORDER — SODIUM CHLORIDE 0.9 % IV SOLN
1020.0000 mg | Freq: Once | INTRAVENOUS | Status: AC
  Administered 2013-06-14: 1020 mg via INTRAVENOUS
  Filled 2013-06-14: qty 34

## 2013-06-14 NOTE — Patient Instructions (Signed)

## 2013-06-14 NOTE — Progress Notes (Signed)
Hold Aranesp at this time, per Dr. Alen Blew.  Patient tolerated Feraheme without difficulty. Discharged with grandson without complaints in stable condition, VSS.

## 2013-06-15 ENCOUNTER — Encounter: Payer: Self-pay | Admitting: Internal Medicine

## 2013-06-15 ENCOUNTER — Telehealth: Payer: Self-pay | Admitting: Internal Medicine

## 2013-06-15 NOTE — Telephone Encounter (Signed)
Yes.  We will help with Hospice Orders.

## 2013-06-15 NOTE — Telephone Encounter (Signed)
Abigail Butts is calling from hospice and palliative care in regards to pt, pt has an appointment on 07/03/13 to establish new pt, pt is a transfer from dr. Linda Hedges. Hospice is needing to know if dr. Elease Hashimoto will give orders for the pt hospice care. Effective today the pt has no pcp at all for care until she is established with dr. Elease Hashimoto. Hospice wants to know if he can resume now if they have to wait until after her appt.

## 2013-06-18 ENCOUNTER — Telehealth: Payer: Self-pay | Admitting: Internal Medicine

## 2013-06-18 NOTE — Telephone Encounter (Signed)
Returned your call.  She would also like to know if Dr. Elease Hashimoto is going assume giving hospice orders immediately and if he is going to assist with symptom management??

## 2013-06-18 NOTE — Telephone Encounter (Signed)
Informed pt per Dr. Elease Hashimoto that he is okay with continuing pt care with hospice

## 2013-06-18 NOTE — Telephone Encounter (Signed)
Left detailed message on Wendy VM.

## 2013-07-03 ENCOUNTER — Ambulatory Visit: Payer: Medicare Other | Admitting: Family Medicine

## 2013-07-04 ENCOUNTER — Ambulatory Visit: Payer: Medicare Other | Admitting: Family Medicine

## 2013-07-05 ENCOUNTER — Other Ambulatory Visit (HOSPITAL_BASED_OUTPATIENT_CLINIC_OR_DEPARTMENT_OTHER): Payer: Medicare Other

## 2013-07-05 DIAGNOSIS — C61 Malignant neoplasm of prostate: Secondary | ICD-10-CM

## 2013-07-05 DIAGNOSIS — D649 Anemia, unspecified: Secondary | ICD-10-CM

## 2013-07-05 LAB — CBC WITH DIFFERENTIAL/PLATELET
BASO%: 0.2 % (ref 0.0–2.0)
BASOS ABS: 0 10*3/uL (ref 0.0–0.1)
EOS ABS: 0.1 10*3/uL (ref 0.0–0.5)
EOS%: 1.4 % (ref 0.0–7.0)
HCT: 30.6 % — ABNORMAL LOW (ref 38.4–49.9)
HEMOGLOBIN: 9.2 g/dL — AB (ref 13.0–17.1)
LYMPH%: 11 % — AB (ref 14.0–49.0)
MCH: 24.1 pg — ABNORMAL LOW (ref 27.2–33.4)
MCHC: 30.1 g/dL — ABNORMAL LOW (ref 32.0–36.0)
MCV: 80.3 fL (ref 79.3–98.0)
MONO#: 0.6 10*3/uL (ref 0.1–0.9)
MONO%: 6.2 % (ref 0.0–14.0)
NEUT%: 81.2 % — AB (ref 39.0–75.0)
NEUTROS ABS: 7.3 10*3/uL — AB (ref 1.5–6.5)
PLATELETS: 173 10*3/uL (ref 140–400)
RBC: 3.81 10*6/uL — ABNORMAL LOW (ref 4.20–5.82)
RDW: 18.8 % — ABNORMAL HIGH (ref 11.0–14.6)
WBC: 9.1 10*3/uL (ref 4.0–10.3)
lymph#: 1 10*3/uL (ref 0.9–3.3)

## 2013-07-09 ENCOUNTER — Encounter: Payer: Self-pay | Admitting: Family Medicine

## 2013-07-09 ENCOUNTER — Ambulatory Visit (INDEPENDENT_AMBULATORY_CARE_PROVIDER_SITE_OTHER): Payer: Medicare Other | Admitting: Family Medicine

## 2013-07-09 VITALS — BP 128/68 | HR 82 | Wt 123.0 lb

## 2013-07-09 DIAGNOSIS — M8560 Other cyst of bone, unspecified site: Secondary | ICD-10-CM

## 2013-07-09 DIAGNOSIS — I10 Essential (primary) hypertension: Secondary | ICD-10-CM

## 2013-07-09 DIAGNOSIS — D649 Anemia, unspecified: Secondary | ICD-10-CM

## 2013-07-09 DIAGNOSIS — E43 Unspecified severe protein-calorie malnutrition: Secondary | ICD-10-CM

## 2013-07-09 DIAGNOSIS — J438 Other emphysema: Secondary | ICD-10-CM

## 2013-07-09 DIAGNOSIS — M8500 Fibrous dysplasia (monostotic), unspecified site: Secondary | ICD-10-CM

## 2013-07-09 NOTE — Progress Notes (Signed)
Pre visit review using our clinic review tool, if applicable. No additional management support is needed unless otherwise documented below in the visit note. 
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
## 2013-07-14 ENCOUNTER — Emergency Department (HOSPITAL_COMMUNITY)

## 2013-07-14 ENCOUNTER — Encounter (HOSPITAL_COMMUNITY): Payer: Self-pay | Admitting: Emergency Medicine

## 2013-07-14 ENCOUNTER — Inpatient Hospital Stay (HOSPITAL_COMMUNITY)
Admission: EM | Admit: 2013-07-14 | Discharge: 2013-07-17 | DRG: 190 | Disposition: A | Discharge status: Hospice | Attending: Internal Medicine | Admitting: Internal Medicine

## 2013-07-14 ENCOUNTER — Inpatient Hospital Stay (HOSPITAL_COMMUNITY)

## 2013-07-14 ENCOUNTER — Other Ambulatory Visit (HOSPITAL_COMMUNITY): Payer: PRIVATE HEALTH INSURANCE

## 2013-07-14 DIAGNOSIS — J9620 Acute and chronic respiratory failure, unspecified whether with hypoxia or hypercapnia: Secondary | ICD-10-CM | POA: Diagnosis present

## 2013-07-14 DIAGNOSIS — D649 Anemia, unspecified: Secondary | ICD-10-CM

## 2013-07-14 DIAGNOSIS — M898X9 Other specified disorders of bone, unspecified site: Secondary | ICD-10-CM

## 2013-07-14 DIAGNOSIS — D696 Thrombocytopenia, unspecified: Secondary | ICD-10-CM

## 2013-07-14 DIAGNOSIS — E43 Unspecified severe protein-calorie malnutrition: Secondary | ICD-10-CM | POA: Diagnosis present

## 2013-07-14 DIAGNOSIS — C61 Malignant neoplasm of prostate: Secondary | ICD-10-CM | POA: Diagnosis present

## 2013-07-14 DIAGNOSIS — M899 Disorder of bone, unspecified: Secondary | ICD-10-CM

## 2013-07-14 DIAGNOSIS — B370 Candidal stomatitis: Secondary | ICD-10-CM | POA: Diagnosis present

## 2013-07-14 DIAGNOSIS — I2789 Other specified pulmonary heart diseases: Secondary | ICD-10-CM | POA: Diagnosis present

## 2013-07-14 DIAGNOSIS — R0602 Shortness of breath: Secondary | ICD-10-CM

## 2013-07-14 DIAGNOSIS — I059 Rheumatic mitral valve disease, unspecified: Secondary | ICD-10-CM | POA: Diagnosis present

## 2013-07-14 DIAGNOSIS — Z87891 Personal history of nicotine dependence: Secondary | ICD-10-CM

## 2013-07-14 DIAGNOSIS — I071 Rheumatic tricuspid insufficiency: Secondary | ICD-10-CM

## 2013-07-14 DIAGNOSIS — I4891 Unspecified atrial fibrillation: Secondary | ICD-10-CM | POA: Diagnosis present

## 2013-07-14 DIAGNOSIS — Z7189 Other specified counseling: Secondary | ICD-10-CM

## 2013-07-14 DIAGNOSIS — I10 Essential (primary) hypertension: Secondary | ICD-10-CM | POA: Diagnosis present

## 2013-07-14 DIAGNOSIS — IMO0002 Reserved for concepts with insufficient information to code with codable children: Secondary | ICD-10-CM

## 2013-07-14 DIAGNOSIS — I079 Rheumatic tricuspid valve disease, unspecified: Secondary | ICD-10-CM | POA: Diagnosis present

## 2013-07-14 DIAGNOSIS — N400 Enlarged prostate without lower urinary tract symptoms: Secondary | ICD-10-CM

## 2013-07-14 DIAGNOSIS — D509 Iron deficiency anemia, unspecified: Secondary | ICD-10-CM | POA: Diagnosis present

## 2013-07-14 DIAGNOSIS — Z9981 Dependence on supplemental oxygen: Secondary | ICD-10-CM

## 2013-07-14 DIAGNOSIS — J438 Other emphysema: Secondary | ICD-10-CM

## 2013-07-14 DIAGNOSIS — S68118A Complete traumatic metacarpophalangeal amputation of other finger, initial encounter: Secondary | ICD-10-CM

## 2013-07-14 DIAGNOSIS — J441 Chronic obstructive pulmonary disease with (acute) exacerbation: Principal | ICD-10-CM | POA: Diagnosis present

## 2013-07-14 DIAGNOSIS — Z7982 Long term (current) use of aspirin: Secondary | ICD-10-CM

## 2013-07-14 DIAGNOSIS — R000 Tachycardia, unspecified: Secondary | ICD-10-CM | POA: Diagnosis not present

## 2013-07-14 DIAGNOSIS — Z66 Do not resuscitate: Secondary | ICD-10-CM | POA: Diagnosis present

## 2013-07-14 DIAGNOSIS — I5032 Chronic diastolic (congestive) heart failure: Secondary | ICD-10-CM | POA: Diagnosis present

## 2013-07-14 DIAGNOSIS — E538 Deficiency of other specified B group vitamins: Secondary | ICD-10-CM

## 2013-07-14 DIAGNOSIS — Z515 Encounter for palliative care: Secondary | ICD-10-CM

## 2013-07-14 DIAGNOSIS — R64 Cachexia: Secondary | ICD-10-CM | POA: Diagnosis present

## 2013-07-14 DIAGNOSIS — D472 Monoclonal gammopathy: Secondary | ICD-10-CM | POA: Diagnosis present

## 2013-07-14 DIAGNOSIS — M8500 Fibrous dysplasia (monostotic), unspecified site: Secondary | ICD-10-CM

## 2013-07-14 HISTORY — DX: Shortness of breath: R06.02

## 2013-07-14 LAB — URINALYSIS, ROUTINE W REFLEX MICROSCOPIC
Bilirubin Urine: NEGATIVE
GLUCOSE, UA: NEGATIVE mg/dL
Hgb urine dipstick: NEGATIVE
Ketones, ur: 15 mg/dL — AB
Leukocytes, UA: NEGATIVE
Nitrite: NEGATIVE
Protein, ur: NEGATIVE mg/dL
SPECIFIC GRAVITY, URINE: 1.036 — AB (ref 1.005–1.030)
Urobilinogen, UA: 1 mg/dL (ref 0.0–1.0)
pH: 5.5 (ref 5.0–8.0)

## 2013-07-14 LAB — BASIC METABOLIC PANEL
BUN: 22 mg/dL (ref 6–23)
CO2: 32 mEq/L (ref 19–32)
Calcium: 9.7 mg/dL (ref 8.4–10.5)
Chloride: 99 mEq/L (ref 96–112)
Creatinine, Ser: 0.83 mg/dL (ref 0.50–1.35)
GFR calc non Af Amer: 79 mL/min — ABNORMAL LOW (ref >90)
Glucose, Bld: 109 mg/dL — ABNORMAL HIGH (ref 70–99)
POTASSIUM: 4.7 meq/L (ref 3.7–5.3)
SODIUM: 144 meq/L (ref 137–147)

## 2013-07-14 LAB — CBC
HCT: 32.5 % — ABNORMAL LOW (ref 39.0–52.0)
Hemoglobin: 9.8 g/dL — ABNORMAL LOW (ref 13.0–17.0)
MCH: 24.6 pg — ABNORMAL LOW (ref 26.0–34.0)
MCHC: 30.2 g/dL (ref 30.0–36.0)
MCV: 81.7 fL (ref 78.0–100.0)
Platelets: 170 10*3/uL (ref 150–400)
RBC: 3.98 MIL/uL — ABNORMAL LOW (ref 4.22–5.81)
RDW: 19.4 % — AB (ref 11.5–15.5)
WBC: 15.1 10*3/uL — AB (ref 4.0–10.5)

## 2013-07-14 LAB — D-DIMER, QUANTITATIVE: D-Dimer, Quant: 2.08 ug/mL-FEU — ABNORMAL HIGH (ref 0.00–0.48)

## 2013-07-14 LAB — I-STAT TROPONIN, ED: TROPONIN I, POC: 0.01 ng/mL (ref 0.00–0.08)

## 2013-07-14 LAB — PRO B NATRIURETIC PEPTIDE: PRO B NATRI PEPTIDE: 564.5 pg/mL — AB (ref 0–450)

## 2013-07-14 LAB — TSH: TSH: 1.14 u[IU]/mL (ref 0.350–4.500)

## 2013-07-14 MED ORDER — LEVALBUTEROL HCL 1.25 MG/0.5ML IN NEBU
1.2500 mg | INHALATION_SOLUTION | RESPIRATORY_TRACT | Status: DC | PRN
  Filled 2013-07-14: qty 0.5

## 2013-07-14 MED ORDER — DILTIAZEM HCL ER COATED BEADS 120 MG PO CP24
120.0000 mg | ORAL_CAPSULE | Freq: Every day | ORAL | Status: DC
  Administered 2013-07-14 – 2013-07-17 (×4): 120 mg via ORAL
  Filled 2013-07-14 (×4): qty 1

## 2013-07-14 MED ORDER — MORPHINE SULFATE (CONCENTRATE) 10 MG /0.5 ML PO SOLN
10.0000 mg | Freq: Three times a day (TID) | ORAL | Status: DC | PRN
  Administered 2013-07-14 – 2013-07-15 (×2): 10 mg via ORAL
  Filled 2013-07-14 (×2): qty 0.5

## 2013-07-14 MED ORDER — ENOXAPARIN SODIUM 40 MG/0.4ML ~~LOC~~ SOLN
40.0000 mg | SUBCUTANEOUS | Status: DC
  Administered 2013-07-14 – 2013-07-17 (×4): 40 mg via SUBCUTANEOUS
  Filled 2013-07-14 (×5): qty 0.4

## 2013-07-14 MED ORDER — SODIUM CHLORIDE 0.9 % IV BOLUS (SEPSIS)
1000.0000 mL | Freq: Once | INTRAVENOUS | Status: AC
  Administered 2013-07-14: 1000 mL via INTRAVENOUS

## 2013-07-14 MED ORDER — LEVOFLOXACIN IN D5W 750 MG/150ML IV SOLN
750.0000 mg | INTRAVENOUS | Status: DC
  Filled 2013-07-14: qty 150

## 2013-07-14 MED ORDER — IPRATROPIUM BROMIDE 0.02 % IN SOLN
0.5000 mg | Freq: Four times a day (QID) | RESPIRATORY_TRACT | Status: DC
  Administered 2013-07-15 – 2013-07-17 (×8): 0.5 mg via RESPIRATORY_TRACT
  Filled 2013-07-14 (×9): qty 2.5

## 2013-07-14 MED ORDER — NYSTATIN 100000 UNIT/ML MT SUSP
5.0000 mL | Freq: Four times a day (QID) | OROMUCOSAL | Status: DC
  Administered 2013-07-14 – 2013-07-17 (×13): 500000 [IU] via ORAL
  Filled 2013-07-14 (×20): qty 5

## 2013-07-14 MED ORDER — ARFORMOTEROL TARTRATE 15 MCG/2ML IN NEBU
15.0000 ug | INHALATION_SOLUTION | Freq: Two times a day (BID) | RESPIRATORY_TRACT | Status: DC
  Administered 2013-07-14 – 2013-07-17 (×7): 15 ug via RESPIRATORY_TRACT
  Filled 2013-07-14 (×9): qty 2

## 2013-07-14 MED ORDER — FINASTERIDE 5 MG PO TABS
5.0000 mg | ORAL_TABLET | Freq: Every day | ORAL | Status: DC
  Administered 2013-07-14 – 2013-07-17 (×4): 5 mg via ORAL
  Filled 2013-07-14 (×5): qty 1

## 2013-07-14 MED ORDER — ENSURE COMPLETE PO LIQD
237.0000 mL | Freq: Two times a day (BID) | ORAL | Status: DC
  Administered 2013-07-14 – 2013-07-17 (×5): 237 mL via ORAL

## 2013-07-14 MED ORDER — DILTIAZEM HCL ER COATED BEADS 120 MG PO CP24
120.0000 mg | ORAL_CAPSULE | Freq: Every day | ORAL | Status: DC
  Filled 2013-07-14: qty 1

## 2013-07-14 MED ORDER — SODIUM CHLORIDE 0.9 % IJ SOLN
3.0000 mL | Freq: Two times a day (BID) | INTRAMUSCULAR | Status: DC
Start: 2013-07-14 — End: 2013-07-17
  Administered 2013-07-14 – 2013-07-17 (×7): 3 mL via INTRAVENOUS

## 2013-07-14 MED ORDER — ONDANSETRON HCL 4 MG PO TABS: 4.0000 mg | ORAL_TABLET | Freq: Four times a day (QID) | ORAL | Status: DC | PRN

## 2013-07-14 MED ORDER — ONDANSETRON HCL 4 MG/2ML IJ SOLN: 4.0000 mg | Freq: Four times a day (QID) | INTRAMUSCULAR | Status: DC | PRN

## 2013-07-14 MED ORDER — IPRATROPIUM BROMIDE 0.02 % IN SOLN
0.5000 mg | RESPIRATORY_TRACT | Status: DC
  Administered 2013-07-14 (×4): 0.5 mg via RESPIRATORY_TRACT
  Filled 2013-07-14 (×4): qty 2.5

## 2013-07-14 MED ORDER — METHYLPREDNISOLONE SODIUM SUCC 125 MG IJ SOLR
125.0000 mg | Freq: Once | INTRAMUSCULAR | Status: AC
  Administered 2013-07-14: 125 mg via INTRAVENOUS
  Filled 2013-07-14: qty 2

## 2013-07-14 MED ORDER — DILTIAZEM HCL 25 MG/5ML IV SOLN
5.0000 mg | Freq: Once | INTRAVENOUS | Status: AC
  Administered 2013-07-14: 5 mg via INTRAVENOUS
  Filled 2013-07-14: qty 5

## 2013-07-14 MED ORDER — PREDNISONE 10 MG PO TABS: 10.0000 mg | ORAL_TABLET | Freq: Every day | ORAL | Status: DC

## 2013-07-14 MED ORDER — SODIUM CHLORIDE 0.9 % IV SOLN: INTRAVENOUS | Status: DC

## 2013-07-14 MED ORDER — FUROSEMIDE 10 MG/ML IJ SOLN
40.0000 mg | Freq: Every day | INTRAMUSCULAR | Status: DC
  Administered 2013-07-14: 40 mg via INTRAVENOUS
  Filled 2013-07-14 (×2): qty 4

## 2013-07-14 MED ORDER — LORAZEPAM 0.5 MG PO TABS
0.5000 mg | ORAL_TABLET | Freq: Three times a day (TID) | ORAL | Status: DC
  Administered 2013-07-14 – 2013-07-17 (×9): 0.5 mg via ORAL
  Filled 2013-07-14 (×9): qty 1

## 2013-07-14 MED ORDER — ENSURE PUDDING PO PUDG: 1.0000 | Freq: Two times a day (BID) | ORAL | Status: DC

## 2013-07-14 MED ORDER — MORPHINE SULFATE (CONCENTRATE) 10 MG /0.5 ML PO SOLN: 10.0000 mg | Freq: Three times a day (TID) | ORAL | Status: DC | PRN

## 2013-07-14 MED ORDER — METOPROLOL TARTRATE 1 MG/ML IV SOLN: 5.0000 mg | Freq: Four times a day (QID) | INTRAVENOUS | Status: DC | PRN

## 2013-07-14 MED ORDER — ASPIRIN EC 81 MG PO TBEC
81.0000 mg | DELAYED_RELEASE_TABLET | Freq: Every day | ORAL | Status: DC
  Administered 2013-07-14 – 2013-07-17 (×4): 81 mg via ORAL
  Filled 2013-07-14 (×5): qty 1

## 2013-07-14 MED ORDER — LEVALBUTEROL HCL 1.25 MG/0.5ML IN NEBU
1.2500 mg | INHALATION_SOLUTION | Freq: Four times a day (QID) | RESPIRATORY_TRACT | Status: DC
  Administered 2013-07-15 – 2013-07-17 (×9): 1.25 mg via RESPIRATORY_TRACT
  Filled 2013-07-14 (×14): qty 0.5

## 2013-07-14 MED ORDER — LEVALBUTEROL HCL 1.25 MG/0.5ML IN NEBU
1.2500 mg | INHALATION_SOLUTION | RESPIRATORY_TRACT | Status: DC
  Administered 2013-07-14 (×2): 1.25 mg via RESPIRATORY_TRACT
  Filled 2013-07-14 (×7): qty 0.5

## 2013-07-14 MED ORDER — IPRATROPIUM-ALBUTEROL 0.5-2.5 (3) MG/3ML IN SOLN: 3.0000 mL | Freq: Four times a day (QID) | RESPIRATORY_TRACT | Status: DC | PRN

## 2013-07-14 MED ORDER — IOHEXOL 300 MG/ML  SOLN: 80.0000 mL | Freq: Once | INTRAMUSCULAR | Status: AC | PRN

## 2013-07-14 MED ORDER — MOMETASONE FURO-FORMOTEROL FUM 200-5 MCG/ACT IN AERO
2.0000 | INHALATION_SPRAY | Freq: Two times a day (BID) | RESPIRATORY_TRACT | Status: DC
  Filled 2013-07-14: qty 8.8

## 2013-07-14 MED ORDER — HEPARIN SODIUM (PORCINE) 5000 UNIT/ML IJ SOLN: 5000.0000 [IU] | Freq: Three times a day (TID) | INTRAMUSCULAR | Status: DC

## 2013-07-14 MED ORDER — PREDNISONE 50 MG PO TABS
50.0000 mg | ORAL_TABLET | Freq: Every day | ORAL | Status: DC
  Filled 2013-07-14 (×2): qty 1

## 2013-07-14 MED ORDER — IOHEXOL 350 MG/ML SOLN
100.0000 mL | Freq: Once | INTRAVENOUS | Status: AC | PRN
  Administered 2013-07-14: 100 mL via INTRAVENOUS

## 2013-07-14 MED ORDER — METHYLPREDNISOLONE SODIUM SUCC 125 MG IJ SOLR
60.0000 mg | Freq: Four times a day (QID) | INTRAMUSCULAR | Status: DC
  Administered 2013-07-14 – 2013-07-15 (×7): 60 mg via INTRAVENOUS
  Filled 2013-07-14 (×7): qty 0.96
  Filled 2013-07-14: qty 2
  Filled 2013-07-14 (×2): qty 0.96

## 2013-07-14 MED ORDER — TAMSULOSIN HCL 0.4 MG PO CAPS
0.4000 mg | ORAL_CAPSULE | Freq: Every day | ORAL | Status: DC
  Administered 2013-07-14 – 2013-07-17 (×4): 0.4 mg via ORAL
  Filled 2013-07-14 (×5): qty 1

## 2013-07-14 MED ORDER — LEVALBUTEROL HCL 0.63 MG/3ML IN NEBU
0.6300 mg | INHALATION_SOLUTION | Freq: Once | RESPIRATORY_TRACT | Status: AC
  Administered 2013-07-14: 0.63 mg via RESPIRATORY_TRACT
  Filled 2013-07-14: qty 3

## 2013-07-14 MED ORDER — BUDESONIDE 0.5 MG/2ML IN SUSP
0.5000 mg | Freq: Two times a day (BID) | RESPIRATORY_TRACT | Status: DC
  Administered 2013-07-14 – 2013-07-17 (×6): 0.5 mg via RESPIRATORY_TRACT
  Filled 2013-07-14 (×9): qty 2

## 2013-07-14 NOTE — Progress Notes (Signed)
ANTICOAGULATION CONSULT NOTE - Initial Consult  Pharmacy Consult for Lovenox Indication: VTE prophylaxis  No Known Allergies  Patient Measurements: Height: 5\' 6"  (167.6 cm) Weight: 119 lb 11.4 oz (54.3 kg) (bedscale due to SOB) IBW/kg (Calculated) : 63.8  Vital Signs: Temp: 98.2 F (36.8 C) (04/25 0637) Temp src: Oral (04/25 0637) BP: 115/57 mmHg (04/25 0637) Pulse Rate: 124 (04/25 0637)  Labs:  Recent Labs  07/14/13 0200  HGB 9.8*  HCT 32.5*  PLT 170  CREATININE 0.83    Estimated Creatinine Clearance: 50.9 ml/min (by C-G formula based on Cr of 0.83).   Medical History: Past Medical History  Diagnosis Date  . Atrial fibrillation   . Diagnosis unknown     lytic lesions-spine-biopsy negative?  . Prostate cancer   . Tricuspid regurgitation     pulmonary hypertension and mitral regurgitation  . Anemia, iron deficiency     f/u by hematology  . Measles   . Chronic obstructive pulmonary emphysema   . Fibrous dysplasia of bone     Bone scan Sept '11 - no mets; skeletal survey Sept '11  . Chronic diastolic CHF (congestive heart failure)     a. Echo 2/12: Mild LVH, EF 55%, mild MR, mild BAE, moderate TR, PASP 56, small effusion  . MGUS (monoclonal gammopathy of unknown significance)     Medications:  Scheduled:  . arformoterol  15 mcg Nebulization BID  . aspirin EC  81 mg Oral Daily  . budesonide (PULMICORT) nebulizer solution  0.5 mg Nebulization BID  . diltiazem  120 mg Oral Daily  . feeding supplement (ENSURE)  1 Container Oral BID BM  . finasteride  5 mg Oral Daily  . furosemide  40 mg Intravenous Daily  . ipratropium  0.5 mg Nebulization Q4H  . levalbuterol  1.25 mg Nebulization Q4H  . LORazepam  0.5 mg Oral TID  . methylPREDNISolone (SOLU-MEDROL) injection  60 mg Intravenous Q6H  . nystatin  5 mL Oral QID  . sodium chloride  3 mL Intravenous Q12H  . tamsulosin  0.4 mg Oral Daily    Assessment: 80 yo male admitted on 4/25 with SOB and cough after his  oxygen machine stopped working. Pharmacy has been consulted to dose Lovenox for VTE prophylaxis. Hg 9.8, plt 170. No s/s of bleeding noted.    Goal of Therapy:  Monitor platelets by anticoagulation protocol: Yes   Plan:  - Start lovenox 40mg  sq daily - Monitor CBC and s/s of bleeding  Roderic Palau A. Pincus Badder, PharmD Clinical Pharmacist - Resident Pager: [phone] Pharmacy: [phone] 07/14/2013 8:16 AM

## 2013-07-14 NOTE — Progress Notes (Addendum)
[facility] PROGRESS NOTE  [name] BJS:[id number] DOB: 1926/08/31 DOA: 07/14/2013 PCP: Eulas Post, MD  Assessment/Plan  Acute on chronic respiratory failure due to acute COPD exacerbation  -  D/c prednisone  -  Start IV solumedrol -  Continue BD but change to xopenex + ipra -  Continue abx -  D/c dulera >> too Aizik Reh of breath to use inhaler -  Start brovana and pulmicort >> watch HR after starting brovana  Atrial fibrillation with tachycardia, HR in 120s and higher on telemetry -  D/c albuterol and change to xopenex -  Give diltiazem now -  Metoprolol prn persistent tachycardia -  TSH 1.14 -  D-dimer elevated, CT angio chest neg for PE  Hospice care patient  -  Patient is on morphine for respiratory distress  -  Patient is DNR/DNI  -  Cont Roxanol as needed   Chronic diastolic dysfunction,  -  IV lasix 40mg  daily for now -  Trend BUN  Severe protein calorie malnutrition with cachexia -  Liberalize diet -  Start supplements -  Nutrition consultation  MGUS/prostate cancer with chronic anemia, hgb stable.  Per oncology  Leukocytosis, may be stress demargination from acute illness -  CXR neg -  UA pending -  Repeat CBC in AM  Diet:  regular Access:  PIV IVF:  yes Proph:  lovenox  Code Status: DNR Family Communication: patient alone Disposition Plan: continue telemetry   Consultants:  None  Procedures:  CXR  Antibiotics:  Levofloxacin 4/25  HPI/Subjective:  Still very SOB.  Unable to eat due to dyspnea.  Increased LEE   Objective: Filed Vitals:   07/14/13 0545 07/14/13 0600 07/14/13 0615 07/14/13 0637  BP: 97/39 101/43 96/45 115/57  Pulse: 123 123 125 124  Temp:    98.2 F (36.8 C)  TempSrc:    Oral  Resp: 17 16 20 24   Height:    5\' 6"  (1.676 m)  Weight:    54.3 kg (119 lb 11.4 oz)  SpO2: 100% 100% 100% 100%    Intake/Output Summary (Last 24 hours) at 07/14/13 0752 Last data filed at 07/14/13 6073  Gross per 24 hour   Intake   1000 ml  Output      0 ml  Net   1000 ml   Filed Weights   07/14/13 0204 07/14/13 0637  Weight: 55.792 kg (123 lb) 54.3 kg (119 lb 11.4 oz)    Exam:   General:  Cachectic AAM, moderate resp distress with tachypnea, SCM, intercostal and subcostal RTX  HEENT:  NCAT, MMM  Cardiovascular:  IRRR and tachycardic, nl S1, S2 no mrg, 2+ pulses, warm extremities  Respiratory:  Diminished with full exp wheeze and prolonged expiratory phase, + rhonchi, no focal rales, no increased WOB  Abdomen:   NABS, soft, NT/ND  MSK:   Normal tone and bulk, 2+ bilateral LEE ankles  Neuro:  Grossly intact  Data Reviewed: Basic Metabolic Panel:  Recent Labs Lab 07/14/13 0200  NA 144  K 4.7  CL 99  CO2 32  GLUCOSE 109*  BUN 22  CREATININE 0.83  CALCIUM 9.7   Liver Function Tests: No results found for this basename: AST, ALT, ALKPHOS, BILITOT, PROT, ALBUMIN,  in the last 168 hours No results found for this basename: LIPASE, AMYLASE,  in the last 168 hours No results found for this basename: AMMONIA,  in the last 168 hours CBC:  Recent Labs Lab 07/14/13 0200  WBC 15.1*  HGB 9.8*  HCT 32.5*  MCV 81.7  PLT 170   Cardiac Enzymes: No results found for this basename: CKTOTAL, CKMB, CKMBINDEX, TROPONINI,  in the last 168 hours BNP (last 3 results)  Recent Labs  11/01/12 0445 03/27/13 1820 07/14/13 0200  PROBNP 1610.0* 981.4* 564.5*   CBG: No results found for this basename: GLUCAP,  in the last 168 hours  No results found for this or any previous visit (from the past 240 hour(s)).   Studies: Dg Chest Port 1 View  07/14/2013   CLINICAL DATA:  Shortness of breath, history COPD, CHF, atrial fibrillation, prostate cancer  EXAM: PORTABLE CHEST - 1 VIEW  COMPARISON:  Portable exam 0227 hr compared to 03/27/2013  FINDINGS: Minimal enlargement of cardiac silhouette.  Tortuous aorta.  Pulmonary vascularity normal.  COPD changes with chronic accentuation of basilar markings  similar to previous exam.  No definite infiltrate, pleural effusion or pneumothorax.  Chronic deformity of the posterolateral right seventh rib again noted.  Bones appear demineralized.  IMPRESSION: COPD changes with chronic accentuation of basilar markings question fibrosis.  No acute abnormalities.   Electronically Signed   By: Lavonia Dana M.D.   On: 07/14/2013 03:05    Scheduled Meds: . aspirin EC  81 mg Oral Daily  . diltiazem  120 mg Oral Daily  . finasteride  5 mg Oral Daily  . heparin  5,000 Units Subcutaneous 3 times per day  . LORazepam  0.5 mg Oral TID  . mometasone-formoterol  2 puff Inhalation BID  . nystatin  5 mL Oral QID  . predniSONE  50 mg Oral Q breakfast  . sodium chloride  3 mL Intravenous Q12H  . tamsulosin  0.4 mg Oral Daily   Continuous Infusions: . sodium chloride      Principal Problem:   COPD exacerbation Active Problems:   Atrial fibrillation   Heart failure, diastolic, chronic   Hypertension   Prostate cancer   Protein-calorie malnutrition, severe   Hospice care patient    Time spent: 30 min    [name] Pager [phone]. If 7PM-7AM, please contact night-coverage at [email or web address], password [facility] 07/14/2013, 7:52 AM  LOS: 0 days

## 2013-07-14 NOTE — ED Notes (Signed)
Admitting MD at bedside.
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
## 2013-07-14 NOTE — ED Notes (Signed)
Pt reports his breathing machine not working, called EMS.  Found in tripod position working hard to breathe.  Given 5mg  albuterol HHN. Pink sputum at lips. Pt has DNR.
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
## 2013-07-14 NOTE — Progress Notes (Signed)
UR Completed.  [name] [name] [phone] 07/14/2013  

## 2013-07-15 DIAGNOSIS — E43 Unspecified severe protein-calorie malnutrition: Secondary | ICD-10-CM

## 2013-07-15 DIAGNOSIS — I10 Essential (primary) hypertension: Secondary | ICD-10-CM

## 2013-07-15 DIAGNOSIS — Z515 Encounter for palliative care: Secondary | ICD-10-CM

## 2013-07-15 LAB — COMPREHENSIVE METABOLIC PANEL
ALT: 9 U/L (ref 0–53)
AST: 15 U/L (ref 0–37)
Albumin: 2.6 g/dL — ABNORMAL LOW (ref 3.5–5.2)
Alkaline Phosphatase: 46 U/L (ref 39–117)
BUN: 31 mg/dL — ABNORMAL HIGH (ref 6–23)
CALCIUM: 8.8 mg/dL (ref 8.4–10.5)
CO2: 30 mEq/L (ref 19–32)
Chloride: 102 mEq/L (ref 96–112)
Creatinine, Ser: 0.87 mg/dL (ref 0.50–1.35)
GFR calc non Af Amer: 77 mL/min — ABNORMAL LOW (ref >90)
GFR, EST AFRICAN AMERICAN: 89 mL/min — AB (ref >90)
Glucose, Bld: 189 mg/dL — ABNORMAL HIGH (ref 70–99)
Potassium: 4.4 mEq/L (ref 3.7–5.3)
SODIUM: 147 meq/L (ref 137–147)
TOTAL PROTEIN: 7 g/dL (ref 6.0–8.3)
Total Bilirubin: 0.3 mg/dL (ref 0.3–1.2)

## 2013-07-15 LAB — CBC
HEMATOCRIT: 28.9 % — AB (ref 39.0–52.0)
HEMOGLOBIN: 8.7 g/dL — AB (ref 13.0–17.0)
MCH: 24.4 pg — ABNORMAL LOW (ref 26.0–34.0)
MCHC: 30.1 g/dL (ref 30.0–36.0)
MCV: 81 fL (ref 78.0–100.0)
Platelets: 175 10*3/uL (ref 150–400)
RBC: 3.57 MIL/uL — ABNORMAL LOW (ref 4.22–5.81)
RDW: 19.7 % — ABNORMAL HIGH (ref 11.5–15.5)
WBC: 13.9 10*3/uL — ABNORMAL HIGH (ref 4.0–10.5)

## 2013-07-15 MED ORDER — MORPHINE SULFATE (CONCENTRATE) 10 MG /0.5 ML PO SOLN
10.0000 mg | ORAL | Status: DC | PRN
  Administered 2013-07-15: 10 mg via ORAL
  Administered 2013-07-16 – 2013-07-17 (×5): 20 mg via ORAL
  Filled 2013-07-15 (×5): qty 1
  Filled 2013-07-15: qty 0.5

## 2013-07-15 MED ORDER — MORPHINE SULFATE (CONCENTRATE) 10 MG /0.5 ML PO SOLN: 10.0000 mg | Freq: Three times a day (TID) | ORAL | Status: DC | PRN

## 2013-07-15 MED ORDER — FUROSEMIDE 40 MG PO TABS
40.0000 mg | ORAL_TABLET | Freq: Two times a day (BID) | ORAL | Status: DC
  Administered 2013-07-15 – 2013-07-17 (×5): 40 mg via ORAL
  Filled 2013-07-15 (×7): qty 1

## 2013-07-15 MED ORDER — LEVALBUTEROL HCL 1.25 MG/0.5ML IN NEBU
1.2500 mg | INHALATION_SOLUTION | RESPIRATORY_TRACT | Status: DC | PRN
  Filled 2013-07-15: qty 0.5

## 2013-07-15 NOTE — Progress Notes (Signed)
[facility] PROGRESS NOTE  [name] GHW:[id number] DOB: Nov 08, 1926 DOA: 07/14/2013 PCP: Eulas Post, MD  Assessment/Plan  Acute on chronic respiratory failure due to acute COPD exacerbation, increasing SOB this morning -  Continue IV solumedrol -  Continue BD but change to xopenex + ipra -  Continue abx -  D/c dulera >> too [place] of breath to use inhaler -  Start brovana and pulmicort >> watch HR after starting brovana -  Continuous pulse ox  Atrial fibrillation with tachycardia, HR in 120s and higher on telemetry initially, but not NSR with PVC -  Continue diltiazem  -  Metoprolol prn persistent tachycardia -  TSH 1.14 -  D-dimer elevated, CT angio chest neg for PE -  Okay to d/c telemetry  Hospice care patient  -  Patient is on morphine for respiratory distress  -  Patient is DNR/DNI  -  Increase prn morphine  Chronic diastolic dysfunction, BUN rising -  Change back to lasix 59m po BID -  Trend BUN  Severe protein calorie malnutrition with cachexia -  Liberalize diet -  Start supplements -  Nutrition consultation  MGUS/prostate cancer with chronic anemia, hgb stable.  Per oncology  Leukocytosis, may be stress demargination from acute illness, trending down despite steroids -  CXR neg -  UA neg  Diet:  regular Access:  PIV IVF:  yes Proph:  lovenox  Code Status: DNR Family Communication: patient alone Disposition Plan: d/c telemetry.  Home once breathing improved   Consultants:  None  Procedures:  CXR  Antibiotics:  Levofloxacin 4/25  HPI/Subjective:  Still very SOB.  Unable to eat due to dyspnea.  Increased LEE   Objective: Filed Vitals:   07/14/13 1134 07/14/13 1433 07/14/13 2201 07/15/13 0613  BP:  108/63 109/60 102/49  Pulse:  105 98 80  Temp:  97.2 F (36.2 C) 97.9 F (36.6 C) 98.2 F (36.8 C)  TempSrc:  Oral Oral Oral  Resp: _0 Height:      Weight:    53.1 kg (117 lb 1 oz)  SpO2: 100% 100% 100% 100%     Intake/Output Summary (Last 24 hours) at 07/15/13 0912 Last data filed at 07/15/13 0800  Gross per 24 hour  Intake   1020 ml  Output   1100 ml  Net    -80 ml   Filed Weights   07/14/13 0204 07/14/13 0637 07/15/13 0613  Weight: 55.792 kg (123 lb) 54.3 kg (119 lb 11.4 oz) 53.1 kg (117 lb 1 oz)    Exam:   General:  Cachectic AAM, moderate resp distress with tachypnea, SCM, intercostal and subcostal RTX, stable from yesterday  HEENT:  NCAT, MMM  Cardiovascular:  IRRR and tachycardic, nl S1, S2 no mrg, 2+ pulses, warm extremities  Respiratory:  Diminished with prolonged expiratory phase, no audible wheeze, rhonchi, no focal rales  Abdomen:   NABS, soft, NT/ND  MSK:   Normal tone and bulk, 2+ bilateral LEE ankles  Neuro:  Grossly intact  Data Reviewed: Basic Metabolic Panel:  Recent Labs Lab 07/14/13 0200 07/15/13 0440  NA 144 147  K 4.7 4.4  CL 99 102  CO2 32 30  GLUCOSE 109* 189*  BUN 22 31*  CREATININE 0.83 0.87  CALCIUM 9.7 8.8   Liver Function Tests:  Recent Labs Lab 07/15/13 0440  AST 15  ALT 9  ALKPHOS 46  BILITOT 0.3  PROT 7.0  ALBUMIN 2.6*   No results found for this  basename: LIPASE, AMYLASE,  in the last 168 hours No results found for this basename: AMMONIA,  in the last 168 hours CBC:  Recent Labs Lab 07/14/13 0200 07/15/13 0440  WBC 15.1* 13.9*  HGB 9.8* 8.7*  HCT 32.5* 28.9*  MCV 81.7 81.0  PLT 170 175   Cardiac Enzymes: No results found for this basename: CKTOTAL, CKMB, CKMBINDEX, TROPONINI,  in the last 168 hours BNP (last 3 results)  Recent Labs  11/01/12 0445 03/27/13 1820 07/14/13 0200  PROBNP 1610.0* 981.4* 564.5*   CBG: No results found for this basename: GLUCAP,  in the last 168 hours  No results found for this or any previous visit (from the past 240 hour(s)).   Studies: Ct Angio Chest Pe W/cm &/or Wo Cm  07/14/2013   CLINICAL DATA:  COPD. Acute shortness of breath. History of fibrous dysplasia.  EXAM: CT  ANGIOGRAPHY CHEST WITH CONTRAST  TECHNIQUE: Multidetector CT imaging of the chest was performed using the standard protocol during bolus administration of intravenous contrast. Multiplanar CT image reconstructions and MIPs were obtained to evaluate the vascular anatomy.  CONTRAST:  100 mL OMNIPAQUE IOHEXOL 350 MG/ML SOLN, 1 OMNIPAQUE IOHEXOL 300 MG/ML SOLN  COMPARISON:  Plain film of the chest 07/14/2013 at 2:27 a.m. PA and lateral chest 03/27/2013. CT chest 04/29/2010.  FINDINGS: No pulmonary embolus is identified. The patient is cachectic. The pulmonary arteries are enlarged compatible pulmonary arterial hypertension. There is mild cardiomegaly. No pleural or pericardial effusion. There is no axillary, hilar or mediastinal lymphadenopathy. Lungs demonstrate extensive emphysematous disease. There is some atelectatic change in the left lung base. The lungs are otherwise clear.  Visualized upper abdomen demonstrates a cystic lesion in the liver, unchanged. Renal cysts are also again identified. Bones show multiple lytic lesions in the spine, sternum and rib which appear unchanged. Expansile bilateral rib lesions are also identified.  Review of the MIP images confirms the above findings.  IMPRESSION: Negative for pulmonary embolus.  No acute disease.  Severe emphysema.  Enlargement of the pulmonary arteries compatible with pulmonary arterial hypertension.  Innumerable lytic bony lesions are unchanged. Findings could be due to multiple myeloma, metastatic disease or polyostotic fibrous dysplasia.   Electronically Signed   By: Thomas  Dalessio M.D.   On: 07/14/2013 16:23   Dg Chest Port 1 View  07/14/2013   CLINICAL DATA:  Shortness of breath, history COPD, CHF, atrial fibrillation, prostate cancer  EXAM: PORTABLE CHEST - 1 VIEW  COMPARISON:  Portable exam 0227 hr compared to 03/27/2013  FINDINGS: Minimal enlargement of cardiac silhouette.  Tortuous aorta.  Pulmonary vascularity normal.  COPD changes with chronic  accentuation of basilar markings similar to previous exam.  No definite infiltrate, pleural effusion or pneumothorax.  Chronic deformity of the posterolateral right seventh rib again noted.  Bones appear demineralized.  IMPRESSION: COPD changes with chronic accentuation of basilar markings question fibrosis.  No acute abnormalities.   Electronically Signed   By: Mark  Boles M.D.   On: 07/14/2013 03:05    Scheduled Meds: . arformoterol  15 mcg Nebulization BID  . aspirin EC  81 mg Oral Daily  . budesonide (PULMICORT) nebulizer solution  0.5 mg Nebulization BID  . diltiazem  120 mg Oral Daily  . enoxaparin (LOVENOX) injection  40 mg Subcutaneous Q24H  . feeding supplement (ENSURE COMPLETE)  237 mL Oral BID BM  . finasteride  5 mg Oral Daily  . furosemide  40 mg Oral BID  . ipratropium  0.5 mg Nebulization   Q6H  . levalbuterol  1.25 mg Nebulization Q6H  . LORazepam  0.5 mg Oral TID  . methylPREDNISolone (SOLU-MEDROL) injection  60 mg Intravenous Q6H  . nystatin  5 mL Oral QID  . sodium chloride  3 mL Intravenous Q12H  . tamsulosin  0.4 mg Oral Daily   Continuous Infusions:    Principal Problem:   COPD exacerbation Active Problems:   Atrial fibrillation   Heart failure, diastolic, chronic   Hypertension   Prostate cancer   Protein-calorie malnutrition, severe   Hospice care patient    Time spent: 30 min    Mackenzie Short  [facility] Pager [phone]. If 7PM-7AM, please contact night-coverage at [email or web address], password [email or web address] 07/15/2013, 9:12 AM  LOS: 1 day              

## 2013-07-15 NOTE — Progress Notes (Signed)
Pt requested RN cancel dinner tray. Pt states he does not feel like eating dinner.

## 2013-07-15 NOTE — Progress Notes (Signed)
Pt asleep during shift change report. At 8am Pt became SOB when sitting on edge of bed to eat. O2 stat 99%. RT was page and gave Pt schedule treatment. Will continue to monitor

## 2013-07-15 NOTE — Progress Notes (Signed)
Palliative medicine consult received and chart reviewed. Will schedule goals of care meeting with patient and his family as soon as there is an available provider. For urgent symptom management needs please call [phone].  Nyomi Howser, DO Palliative Medicine  

## 2013-07-15 NOTE — Progress Notes (Signed)
Pt on continuous O2 monitor. O2 drop to 75-80% when pt stood up to use urinal, O2 return to 100% when Pt set back in bed

## 2013-07-16 DIAGNOSIS — I5032 Chronic diastolic (congestive) heart failure: Secondary | ICD-10-CM

## 2013-07-16 DIAGNOSIS — I4891 Unspecified atrial fibrillation: Secondary | ICD-10-CM

## 2013-07-16 DIAGNOSIS — E538 Deficiency of other specified B group vitamins: Secondary | ICD-10-CM

## 2013-07-16 LAB — CBC
HEMATOCRIT: 27.8 % — AB (ref 39.0–52.0)
HEMOGLOBIN: 8.4 g/dL — AB (ref 13.0–17.0)
MCH: 24.5 pg — ABNORMAL LOW (ref 26.0–34.0)
MCHC: 30.2 g/dL (ref 30.0–36.0)
MCV: 81 fL (ref 78.0–100.0)
Platelets: 168 10*3/uL (ref 150–400)
RBC: 3.43 MIL/uL — AB (ref 4.22–5.81)
RDW: 19.7 % — ABNORMAL HIGH (ref 11.5–15.5)
WBC: 12.5 10*3/uL — AB (ref 4.0–10.5)

## 2013-07-16 MED ORDER — PREDNISONE 20 MG PO TABS: ORAL_TABLET | ORAL | Status: AC

## 2013-07-16 MED ORDER — PREDNISONE 50 MG PO TABS
60.0000 mg | ORAL_TABLET | Freq: Two times a day (BID) | ORAL | Status: DC
  Administered 2013-07-16 – 2013-07-17 (×2): 60 mg via ORAL
  Filled 2013-07-16 (×4): qty 1

## 2013-07-16 MED ORDER — ARFORMOTEROL TARTRATE 15 MCG/2ML IN NEBU: 15.0000 ug | INHALATION_SOLUTION | Freq: Two times a day (BID) | RESPIRATORY_TRACT | Status: AC

## 2013-07-16 MED ORDER — BUDESONIDE 0.5 MG/2ML IN SUSP: 0.5000 mg | Freq: Two times a day (BID) | RESPIRATORY_TRACT | Status: AC

## 2013-07-16 MED ORDER — MORPHINE SULFATE (CONCENTRATE) 10 MG /0.5 ML PO SOLN: 10.0000 mg | ORAL | Status: AC | PRN

## 2013-07-16 MED ORDER — FERROUS SULFATE 325 (65 FE) MG PO TABS
325.0000 mg | ORAL_TABLET | Freq: Two times a day (BID) | ORAL | Status: DC
  Administered 2013-07-16 – 2013-07-17 (×2): 325 mg via ORAL
  Filled 2013-07-16 (×4): qty 1

## 2013-07-16 MED ORDER — FERROUS SULFATE 325 (65 FE) MG PO TABS: 325.0000 mg | ORAL_TABLET | Freq: Two times a day (BID) | ORAL | Status: AC

## 2013-07-16 MED ORDER — ENSURE COMPLETE PO LIQD: 237.0000 mL | Freq: Two times a day (BID) | ORAL | Status: AC

## 2013-07-16 MED ORDER — METHYLPREDNISOLONE SODIUM SUCC 125 MG IJ SOLR
60.0000 mg | Freq: Four times a day (QID) | INTRAMUSCULAR | Status: DC
  Administered 2013-07-16 (×2): 60 mg via INTRAVENOUS
  Filled 2013-07-16 (×5): qty 0.96

## 2013-07-16 MED ORDER — VITAMIN B-12 1000 MCG PO TABS
1000.0000 ug | ORAL_TABLET | Freq: Every day | ORAL | Status: DC
  Administered 2013-07-16 – 2013-07-17 (×2): 1000 ug via ORAL
  Filled 2013-07-16 (×2): qty 1

## 2013-07-16 MED ORDER — CYANOCOBALAMIN 1000 MCG PO TABS: 1000.0000 ug | ORAL_TABLET | Freq: Every day | ORAL | Status: AC

## 2013-07-16 MED ORDER — NYSTATIN 100000 UNIT/ML MT SUSP: 5.0000 mL | Freq: Four times a day (QID) | OROMUCOSAL | Status: AC

## 2013-07-16 NOTE — Progress Notes (Addendum)
[facility] PROGRESS NOTE  [name] QMG:[id number] DOB: [birth year] DOA: 07/14/2013 PCP: Eulas Post, MD  Assessment/Plan  Acute on chronic respiratory failure due to acute COPD exacerbation -  Transition to twice daily prednisone -  Continue BD but change to xopenex + ipra -  Continue abx -  D/c dulera >> too [place] of breath to use inhaler -  Start brovana and pulmicort >> watch HR after starting brovana -  Continuous pulse ox  Atrial fibrillation with tachycardia, HR in 120s and higher on telemetry initially, but not NSR with PVC -  Continue diltiazem  -  Metoprolol prn persistent tachycardia -  TSH 1.14 -  D-dimer elevated, CT angio chest neg for PE  Hospice care patient  -  Increased dose and frequency of morphine for respiratory distress yesterday -  Patient is DNR/DNI   Chronic diastolic dysfunction, BUN rising -  Continue lasix $RemoveBefore'40mg'uuxhwCfoQrCab$  po BID  Severe protein calorie malnutrition with cachexia -  Liberalize diet -  Start supplements -  Nutrition consultation  MGUS/prostate cancer with chronic anemia, hgb trending down.   -  close f/u with oncology -  Iron studies suggest iron deficiency last month >> start ferrous sulfate BID -  vit B12 < 400, start daily vitamin B12 supplement -  TSH wnl  Leukocytosis, may be stress demargination from acute illness, trending down despite steroids -  CXR neg -  UA neg  Diet:  regular Access:  PIV IVF:  yes Proph:  lovenox  Code Status: DNR Family Communication: patient alone Disposition Plan:  Palliative care consult, finding out how much oxygen he needs to maintain O2 sats.  Would like him to get home ASAP since he has end-stage COPD with poor prognosis and is hospice patient.  Hopefully SOB well enough for home tomorrow.     Consultants:  Palliative care  Procedures:  CXR  Antibiotics:  Levofloxacin 4/25  HPI/Subjective:  SOB improved today.  Desat to 70s on oxygen with ambulation.     Objective: Filed Vitals:   07/15/13 1344 07/15/13 2035 07/16/13 0608 07/16/13 0800  BP: 101/34 108/49 107/50   Pulse: 82 77 72   Temp: 98.4 F (36.9 C) 98.1 F (36.7 C) 97.3 F (36.3 C)   TempSrc: Oral Oral Oral   Resp: $Remo'20 22 19   'abelE$ Height:      Weight:   53 kg (116 lb 13.5 oz)   SpO2: 100% 100% 100% 98%    Intake/Output Summary (Last 24 hours) at 07/16/13 1027 Last data filed at 07/16/13 0644  Gross per 24 hour  Intake    720 ml  Output   1100 ml  Net   -380 ml   Filed Weights   07/14/13 0637 07/15/13 0613 07/16/13 0608  Weight: 54.3 kg (119 lb 11.4 oz) 53.1 kg (117 lb 1 oz) 53 kg (116 lb 13.5 oz)    Exam:   General:  Cachectic AAM, mild resp distress with SCM and subcostal RTX at rest, stable from baseline  HEENT:  NCAT, MMM  Cardiovascular:  IRRR, nl S1, S2 no mrg, 2+ pulses, warm extremities  Respiratory:  Diminished with prolonged expiratory phase, no audible wheeze, rhonchi, no focal rales  Abdomen:   NABS, soft, NT/ND  MSK:   Normal tone and bulk, 2+ bilateral LEE ankles  Neuro:  Grossly intact  Data Reviewed: Basic Metabolic Panel:  Recent Labs Lab 07/14/13 0200 07/15/13 0440  NA 144 147  K 4.7 4.4  CL 99 102  CO2 32 30  GLUCOSE 109* 189*  BUN 22 31*  CREATININE 0.83 0.87  CALCIUM 9.7 8.8   Liver Function Tests:  Recent Labs Lab 07/15/13 0440  AST 15  ALT 9  ALKPHOS 46  BILITOT 0.3  PROT 7.0  ALBUMIN 2.6*   No results found for this basename: LIPASE, AMYLASE,  in the last 168 hours No results found for this basename: AMMONIA,  in the last 168 hours CBC:  Recent Labs Lab 07/14/13 0200 07/15/13 0440 07/16/13 0500  WBC 15.1* 13.9* 12.5*  HGB 9.8* 8.7* 8.4*  HCT 32.5* 28.9* 27.8*  MCV 81.7 81.0 81.0  PLT 170 175 168   Cardiac Enzymes: No results found for this basename: CKTOTAL, CKMB, CKMBINDEX, TROPONINI,  in the last 168 hours BNP (last 3 results)  Recent Labs  11/01/12 0445 03/27/13 1820 07/14/13 0200  PROBNP  1610.0* 981.4* 564.5*   CBG: No results found for this basename: GLUCAP,  in the last 168 hours  No results found for this or any previous visit (from the past 240 hour(s)).   Studies: Ct Angio Chest Pe W/cm &/or Wo Cm  07/14/2013   CLINICAL DATA:  COPD. Acute shortness of breath. History of fibrous dysplasia.  EXAM: CT ANGIOGRAPHY CHEST WITH CONTRAST  TECHNIQUE: Multidetector CT imaging of the chest was performed using the standard protocol during bolus administration of intravenous contrast. Multiplanar CT image reconstructions and MIPs were obtained to evaluate the vascular anatomy.  CONTRAST:  100 mL OMNIPAQUE IOHEXOL 350 MG/ML SOLN, 1 OMNIPAQUE IOHEXOL 300 MG/ML SOLN  COMPARISON:  Plain film of the chest 07/14/2013 at 2:27 a.m. PA and lateral chest 03/27/2013. CT chest 04/29/2010.  FINDINGS: No pulmonary embolus is identified. The patient is cachectic. The pulmonary arteries are enlarged compatible pulmonary arterial hypertension. There is mild cardiomegaly. No pleural or pericardial effusion. There is no axillary, hilar or mediastinal lymphadenopathy. Lungs demonstrate extensive emphysematous disease. There is some atelectatic change in the left lung base. The lungs are otherwise clear.  Visualized upper abdomen demonstrates a cystic lesion in the liver, unchanged. Renal cysts are also again identified. Bones show multiple lytic lesions in the spine, sternum and rib which appear unchanged. Expansile bilateral rib lesions are also identified.  Review of the MIP images confirms the above findings.  IMPRESSION: Negative for pulmonary embolus.  No acute disease.  Severe emphysema.  Enlargement of the pulmonary arteries compatible with pulmonary arterial hypertension.  Innumerable lytic bony lesions are unchanged. Findings could be due to multiple myeloma, metastatic disease or polyostotic fibrous dysplasia.   Electronically Signed   By: Inge Rise M.D.   On: 07/14/2013 16:23    Scheduled Meds: .  arformoterol  15 mcg Nebulization BID  . aspirin EC  81 mg Oral Daily  . budesonide (PULMICORT) nebulizer solution  0.5 mg Nebulization BID  . diltiazem  120 mg Oral Daily  . enoxaparin (LOVENOX) injection  40 mg Subcutaneous Q24H  . feeding supplement (ENSURE COMPLETE)  237 mL Oral BID BM  . finasteride  5 mg Oral Daily  . furosemide  40 mg Oral BID  . ipratropium  0.5 mg Nebulization Q6H  . levalbuterol  1.25 mg Nebulization Q6H  . LORazepam  0.5 mg Oral TID  . methylPREDNISolone (SOLU-MEDROL) injection  60 mg Intravenous Q6H  . nystatin  5 mL Oral QID  . sodium chloride  3 mL Intravenous Q12H  . tamsulosin  0.4 mg Oral Daily   Continuous Infusions:    Principal Problem:  COPD exacerbation Active Problems:   Atrial fibrillation   Heart failure, diastolic, chronic   Hypertension   Prostate cancer   Protein-calorie malnutrition, severe   Hospice care patient    Time spent: 30 min    [facility] Pager [phone]. If 7PM-7AM, please contact night-coverage at [email or web address], password [facility] 07/16/2013, 10:27 AM  LOS: 2 days

## 2013-07-16 NOTE — Progress Notes (Signed)
SATURATION QUALIFICATIONS: (This note is used to comply with regulatory documentation for home oxygen)  Patient Saturations on 3L [place] at Rest = 100%  Patient Saturations on 3 Liters of oxygen while Ambulating = 94%  Please briefly explain why patient needs home oxygen: Pt is already on 3L [place] at home. Pt being evaluated to see if he O2 level dropped with 3L. Pt maintained O2 sat >92%.

## 2013-07-16 NOTE — Progress Notes (Signed)
[facility] Work note Patient was admitted over the weekend with respiratory distress-he did not notify [facility] of admission. Patient lives alone at [facility] ([place] apartment complex), yet has a daughter, grandson that live locally. He reports that 02 concentrator malfunctioned and he was unable to locate portable tanks, medications, nebulizer to treat sx. He went to find a neighbor who called 911. Patient has DNR and MOST form in the home. Patient has experienced more recent respiratory decline and is well managed with medications, oxygen in the home. The greatest concern is his lack of cg to help in the home. Family assist as they are  able, yet are not in the home to assist when he becomes weak and could use supplemental care, direction. He declines any other living arrangements suggested and voiced a feeling that he can manage at home. All supportive services are being utilized- Therapist, sports, Neurosurgeon, Chaplain, [employer] and [employer].  Patient reports a desire to return home upon discharge. Support, education offered. Katherina Right, [place]

## 2013-07-16 NOTE — Progress Notes (Signed)
Patient evaluated for community based chronic disease management services with [facility] as a benefit of patient's [employer].  Patient is currently managed by Hospice services at home.  Noted a palliative goals of care meeting is scheduled for 4.27.15.  If an acute symptom management plan is not established to reduce the risk of readmission [facility] will engage to assist as needed. Made Inpatient Case Manager aware that [facility] following. Of note, [facility] Care Management services does not replace or interfere with any services that are arranged by inpatient case management or social work.  For additional questions or referrals please contact Corliss Blacker BSN RN [facility] Liaison at [phone].

## 2013-07-16 NOTE — Progress Notes (Addendum)
Inpatient RN visit-Early [facility] [place] Room 13-[facility] RN Visit-Karen Alford Highland RN  Related admission to [facility] diagnosis of CHF. Pt is DNR code. OOF DNR and MOST form in place on pt chart.  Pt alert & oriented,sitting on the side of the bed eating lunch. O2 in place.Pt able to recall events that led to this hospitalization. Pt reported, and it is also noted in the ED note, that his O2 concentrator was not working causing him to be very dyspneic, he was able to get to a neighbor for help. His neighbor called EMS.  Assured pt that the concentrator would be serviced or exchanged prior to him going home. Pt stated that his grandson would be taking him home via [place] at discharge, he will need a travel O2 tank. Pt  reported being short of breath at end of our conversation, staff RN [facility] notified and will f/u. *noted current increase in liquid morphine concentrate dose per MAR-current dose of liquid morphine 10-20mg  q 4hrs PRN. Pt had recvd 1 dose of 20mg  this morning with good effect Discussed above with Dr. Sheran Fava and addressed PMT consult, at this time pt's goals are set and symptoms managed with current medication regime. Dr. Sheran Fava in agreement that PMT consult  Is not needed at this time. PMT Dr. Hilma Favors aware.  Spoke with pt's grandson Antony Haste by phone [phone]) to advise that [facility] would be contacting him to arrange for concentrator servicing/replacement. Antony Haste stated that he would be available after 4pm today.   Patient's home medication list and transfer summary in place on shadow chart.   Please call [facility] @ [phone]-with any hospice needs.   Thank you. Tracey Harries, RN  [facility]  Hospice Liaison  [phone])

## 2013-07-16 NOTE — Discharge Summary (Signed)
Physician Discharge Summary  [name] UDJ:[id number] DOB: [birth year] DOA: 07/14/2013  PCP: Eulas Post, MD  Admit date: 07/14/2013 Discharge date: Aug 12, 2013  Recommendations for Outpatient Follow-up:  1. Home with home hospice.   2. Continue 3 L oxygen at rest and with exertion 3. Given new prescription for morphine to use as needed for shortness of breath.  Changed advair to pulmicort and brovana.    4. Prednisone and azithromycin  Discharge Diagnoses:  Principal Problem:   COPD exacerbation Active Problems:   Atrial fibrillation   Heart failure, diastolic, chronic   Hypertension   Prostate cancer   Protein-calorie malnutrition, severe   Hospice care patient   Discharge Condition: Fair, stable  Diet recommendation: Regular with dietary supplements twice daily  Wt Readings from Last 3 Encounters:  Aug 12, 2013 53.6 kg (118 lb 2.7 oz)  07/09/13 55.792 kg (123 lb)  06/07/13 59.875 kg (132 lb)    History of present illness:  [name] is a 80 y.o. male with Past medical history of vision, diastolic dysfunction, COPD endstage, MGUS, prostate cancer, anemia, chronic oxygen dependency.  The patient presented to the hospital as he was having shortness of breath. He mentions that his oxygen machine stopped working after that he started having severe coughing spell which lasted for a few minutes this was followed by progressively worsening shortness of breath, at which time he called EMS.  Patient at the time of my evaluation for shortness of breath has improved but not to his baseline, he continues to have some cough and tachypnea.  Other than that he does not have any complaints of fever, chills, chest pain, palpitation, nausea, vomiting, abdominal pain, diarrhea, burning urination.  He mentions he is compliant with all of his medications.  The patient is coming from home. And at his baseline independent for most of his ADL   Hospital Course:   Acute on chronic  respiratory failure due to acute COPD exacerbation. He was in moderate to severe distress when he was admitted. He was started on IV steroids and given bronchodilators. Due to tachycardia, his albuterol was changed to Xopenex. Because he was so [place] of breath, his inhaler version of inhaled corticosteroid and long-acting beta agonist were discontinued. He was started on Brovana and Pulmicort.  He was also given antibiotics. He had steady improvement in his breathing.  On the day of discharge, he is maintaining oxygen saturations greater than 92% on 3 L nasal cannula both at rest and with exertion. He should continue his morphine as needed for shortness of breath. I have increased his dose slightly from 5-10 mg per dose to 10-20 mg per dose, and he may take it up to every 4 hours as needed. Hospice care workers saw him on he was in the hospital, and air range for him to have a new oxygen machine at home. they will continue to follow him.  Atrial fibrillation with tachycardia, heart rate in the 120s or higher on telemetry initially. He was continued on his oral diltiazem. He was given metoprolol IV as needed for persistent tachycardia. His bronchodilators were changed from albuterol to Xopenex. His TSH was 1.14. His d-dimer was elevated but CT anterior chest was negative for pulmonary bolus him. His heart rate decreased to the 70s to 90s at baseline.  Chronic diastolic heart failure, with lower extremity edema. He was started on IV Lasix 40 mg twice a day, however his BUN began to rise. He was restarted on his oral Lasix 40  mg twice daily.  Severe protein calorie malnutrition with cachexia due to end-stage COPD. He was given a regular diet with supplements. He was seen by nutrition consultation. He is advised to eat small frequent meals throughout the day.  MGUS/prostate cancer with chronic anemia. His hemoglobin trended down somewhat during admission. His iron studies last month suggested iron deficiency  anemia however he was started on iron supplementation. I started him on ferrous sulfate twice daily. His vitamin B12 level was in the low normal range, less than 400. He was started on empiric vitamin B12 supplements.  His TSH was within normal limits. It is possible that he is having worsening anemia secondary to conversion of his MGUS to myeloma. He is advised to have followup with his oncologist. Given his poor functional status and severe malnutrition, and the fact that he is hospice care, he is likely not a good candidate for aggressive therapies.  Leukocytosis, likely stressed margination from acute illness and trended down as his  steroids were tapered. Chest x-ray was negative for infiltrate. Urinalysis was also negative.9  Consultants:  Hospice  Procedures:  CXR Antibiotics:  None   Discharge Exam: Filed Vitals:   07-18-2013 0951  BP: 105/58  Pulse:   Temp:   Resp:    Filed Vitals:   07/16/13 2051 July 18, 2013 0643 18-Jul-2013 0902 07/18/13 0951  BP: 127/47 96/57  105/58  Pulse: 87 71    Temp: 98 F (36.7 C) 98 F (36.7 C)    TempSrc: Oral Oral    Resp: 20 18    Height:      Weight:  53.6 kg (118 lb 2.7 oz)    SpO2: 100% 100% 97%     General: Cachectic AAM, mild resp distress with SCM and subcostal RTX at rest, stable from baseline  HEENT: NCAT, MMM  Cardiovascular: IRRR, nl S1, S2 no mrg, 2+ pulses, warm extremities  Respiratory: Diminished with prolonged expiratory phase, no audible wheeze, rhonchi, no focal rales  Abdomen: NABS, soft, NT/ND  MSK: Normal tone and bulk, 2+ bilateral LEE ankles  Neuro: Grossly intact   Discharge Instructions      Discharge Orders   Future Appointments Provider Department Dept Phone   08/02/2013 1:00 PM [facility] Only [place] [phone]   08/30/2013 1:00 PM [facility] [phone]   09/06/2013 2:00 PM Gayla Doss [place]  Office [phone]   09/27/2013 1:00 PM [facility] [phone]   10/08/2013 1:30 PM Eulas Post, MD [place] [facility] [place]   10/25/2013 1:00 PM [facility] Only [place] [phone]   11/22/2013 1:00 PM [facility] [phone]   11/22/2013 1:30 PM Wyatt Portela, MD [facility] [phone]   Future Orders Complete By Expires   Call MD for:  difficulty breathing, headache or visual disturbances  As directed    Call MD for:  extreme fatigue  As directed    Call MD for:  hives  As directed    Call MD for:  persistant dizziness or light-headedness  As directed    Call MD for:  persistant nausea and vomiting  As directed    Call MD for:  severe uncontrolled pain  As directed    Call MD for:  temperature >100.4  As directed    Diet  general  As directed    Discharge instructions  As directed    Increase activity slowly  As directed        Medication List    STOP taking these medications       fluticasone-salmeterol 230-21 MCG/ACT inhaler  Commonly known as:  ADVAIR HFA     PRESCRIPTION MEDICATION      TAKE these medications       AEROCHAMBER PLUS FLO-VU MEDIUM Misc  1 each by Other route once.     albuterol 108 (90 BASE) MCG/ACT inhaler  Commonly known as:  PROVENTIL HFA;VENTOLIN HFA  Inhale 1-3 puffs into the lungs every 4 (four) hours as needed for wheezing or shortness of breath.     arformoterol 15 MCG/2ML Nebu  Commonly known as:  BROVANA  Take 2 mLs (15 mcg total) by nebulization 2 (two) times daily.     aspirin EC 81 MG tablet  Take 81 mg by mouth daily.     azithromycin 250 MG tablet  Commonly known as:  ZITHROMAX Z-PAK  Take 2 tabs on day 1, then take 1 tab daily for the next 4 days.     budesonide 0.5 MG/2ML nebulizer solution  Commonly known as:  PULMICORT  Take 2 mLs (0.5 mg  total) by nebulization 2 (two) times daily.     cyanocobalamin 1000 MCG tablet  Take 1 tablet (1,000 mcg total) by mouth daily.     diltiazem 120 MG 24 hr capsule  Commonly known as:  CARDIZEM CD  Take 120 mg by mouth daily.     feeding supplement (ENSURE COMPLETE) Liqd  Take 237 mLs by mouth 2 (two) times daily between meals.     ferrous sulfate 325 (65 FE) MG tablet  Take 1 tablet (325 mg total) by mouth 2 (two) times daily with a meal.     finasteride 5 MG tablet  Commonly known as:  PROSCAR  Take 5 mg by mouth daily.     furosemide 40 MG tablet  Commonly known as:  LASIX  Take 40 mg by mouth 2 (two) times daily.     ipratropium-albuterol 0.5-2.5 (3) MG/3ML Soln  Commonly known as:  DUONEB  Take 3 mLs by nebulization every 6 (six) hours as needed (shortness of breath and wheezing).     LORazepam 0.5 MG tablet  Commonly known as:  ATIVAN  Take 0.5 mg by mouth 3 (three) times daily.     morphine CONCENTRATE 10 mg / 0.5 ml concentrated solution  Take 0.5-1 mLs (10-20 mg total) by mouth every 4 (four) hours as needed for severe pain, anxiety or shortness of breath.     nystatin 100000 UNIT/ML suspension  Commonly known as:  MYCOSTATIN  Take 5 mLs (500,000 Units total) by mouth 4 (four) times daily.     OVER THE COUNTER MEDICATION  Take 10 mLs by mouth daily as needed (congestion). Over the counter children's decongestant liquid     predniSONE 20 MG tablet  Commonly known as:  DELTASONE  Take 3 tabs daily x 5 days, 2 tabs daily x 5 days, 1 tab daily x 5 days, then half tab daily x 6 days.     tamsulosin 0.4 MG Caps capsule  Commonly known as:  FLOMAX  Take 1 capsule (0.4 mg total) by mouth daily.       Follow-up Information   Follow up with Eulas Post, MD In 1 week. (As needed)    Specialty:  Family Medicine  Contact information:   [place] [address] [phone]        The results of significant diagnostics from this  hospitalization (including imaging, microbiology, ancillary and laboratory) are listed below for reference.    Significant Diagnostic Studies: Ct Angio Chest Pe W/cm &/or Wo Cm  07/14/2013   CLINICAL DATA:  COPD. Acute shortness of breath. History of fibrous dysplasia.  EXAM: CT ANGIOGRAPHY CHEST WITH CONTRAST  TECHNIQUE: Multidetector CT imaging of the chest was performed using the standard protocol during bolus administration of intravenous contrast. Multiplanar CT image reconstructions and MIPs were obtained to evaluate the vascular anatomy.  CONTRAST:  100 mL OMNIPAQUE IOHEXOL 350 MG/ML SOLN, 1 OMNIPAQUE IOHEXOL 300 MG/ML SOLN  COMPARISON:  Plain film of the chest 07/14/2013 at 2:27 a.m. PA and lateral chest 03/27/2013. CT chest 04/29/2010.  FINDINGS: No pulmonary embolus is identified. The patient is cachectic. The pulmonary arteries are enlarged compatible pulmonary arterial hypertension. There is mild cardiomegaly. No pleural or pericardial effusion. There is no axillary, hilar or mediastinal lymphadenopathy. Lungs demonstrate extensive emphysematous disease. There is some atelectatic change in the left lung base. The lungs are otherwise clear.  Visualized upper abdomen demonstrates a cystic lesion in the liver, unchanged. Renal cysts are also again identified. Bones show multiple lytic lesions in the spine, sternum and rib which appear unchanged. Expansile bilateral rib lesions are also identified.  Review of the MIP images confirms the above findings.  IMPRESSION: Negative for pulmonary embolus.  No acute disease.  Severe emphysema.  Enlargement of the pulmonary arteries compatible with pulmonary arterial hypertension.  Innumerable lytic bony lesions are unchanged. Findings could be due to multiple myeloma, metastatic disease or polyostotic fibrous dysplasia.   Electronically Signed   By: Inge Rise M.D.   On: 07/14/2013 16:23   Dg Chest Port 1 View  07/14/2013   CLINICAL DATA:  Shortness of  breath, history COPD, CHF, atrial fibrillation, prostate cancer  EXAM: PORTABLE CHEST - 1 VIEW  COMPARISON:  Portable exam 0227 hr compared to 03/27/2013  FINDINGS: Minimal enlargement of cardiac silhouette.  Tortuous aorta.  Pulmonary vascularity normal.  COPD changes with chronic accentuation of basilar markings similar to previous exam.  No definite infiltrate, pleural effusion or pneumothorax.  Chronic deformity of the posterolateral right seventh rib again noted.  Bones appear demineralized.  IMPRESSION: COPD changes with chronic accentuation of basilar markings question fibrosis.  No acute abnormalities.   Electronically Signed   By: Lavonia Dana M.D.   On: 07/14/2013 03:05    Microbiology: No results found for this or any previous visit (from the past 240 hour(s)).   Labs: Basic Metabolic Panel:  Recent Labs Lab 07/14/13 0200 07/15/13 0440 Jul 31, 2013 0543  NA 144 147 143  K 4.7 4.4 4.4  CL 99 102 99  CO2 32 30 32  GLUCOSE 109* 189* 146*  BUN 22 31* 40*  CREATININE 0.83 0.87 0.83  CALCIUM 9.7 8.8 9.0  MG  --   --  2.5  PHOS  --   --  3.4   Liver Function Tests:  Recent Labs Lab 07/15/13 0440  AST 15  ALT 9  ALKPHOS 46  BILITOT 0.3  PROT 7.0  ALBUMIN 2.6*   No results found for this basename: LIPASE, AMYLASE,  in the last 168 hours No results found for this basename: AMMONIA,  in the last 168 hours CBC:  Recent Labs Lab 07/14/13 0200 07/15/13 0440 07/16/13 0500 Jul 31, 2013 0543  WBC  15.1* 13.9* 12.5* 11.7*  HGB 9.8* 8.7* 8.4* 9.1*  HCT 32.5* 28.9* 27.8* 30.1*  MCV 81.7 81.0 81.0 80.3  PLT 170 175 168 177   Cardiac Enzymes: No results found for this basename: CKTOTAL, CKMB, CKMBINDEX, TROPONINI,  in the last 168 hours BNP: BNP (last 3 results)  Recent Labs  11/01/12 0445 03/27/13 1820 07/14/13 0200  PROBNP 1610.0* 981.4* 564.5*   CBG: No results found for this basename: GLUCAP,  in the last 168 hours  Time coordinating discharge: 45  minutes  Signed:  Janece Canterbury  [facility] 08-06-13, 10:40 AM

## 2013-07-17 ENCOUNTER — Emergency Department (HOSPITAL_COMMUNITY)
Admission: EM | Admit: 2013-07-17 | Discharge: 2013-07-20 | Disposition: E | Attending: Emergency Medicine | Admitting: Emergency Medicine

## 2013-07-17 ENCOUNTER — Encounter (HOSPITAL_COMMUNITY): Payer: Self-pay | Admitting: Emergency Medicine

## 2013-07-17 LAB — CBC
HCT: 30.1 % — ABNORMAL LOW (ref 39.0–52.0)
HEMOGLOBIN: 9.1 g/dL — AB (ref 13.0–17.0)
MCH: 24.3 pg — AB (ref 26.0–34.0)
MCHC: 30.2 g/dL (ref 30.0–36.0)
MCV: 80.3 fL (ref 78.0–100.0)
Platelets: 177 10*3/uL (ref 150–400)
RBC: 3.75 MIL/uL — ABNORMAL LOW (ref 4.22–5.81)
RDW: 19.7 % — ABNORMAL HIGH (ref 11.5–15.5)
WBC: 11.7 10*3/uL — ABNORMAL HIGH (ref 4.0–10.5)

## 2013-07-17 LAB — BASIC METABOLIC PANEL
BUN: 40 mg/dL — ABNORMAL HIGH (ref 6–23)
CO2: 32 meq/L (ref 19–32)
CREATININE: 0.83 mg/dL (ref 0.50–1.35)
Calcium: 9 mg/dL (ref 8.4–10.5)
Chloride: 99 mEq/L (ref 96–112)
GFR calc Af Amer: >90 mL/min (ref >90)
GFR calc non Af Amer: 79 mL/min — ABNORMAL LOW (ref >90)
Glucose, Bld: 146 mg/dL — ABNORMAL HIGH (ref 70–99)
Potassium: 4.4 mEq/L (ref 3.7–5.3)
Sodium: 143 mEq/L (ref 137–147)

## 2013-07-17 LAB — MAGNESIUM: Magnesium: 2.5 mg/dL (ref 1.5–2.5)

## 2013-07-17 LAB — PHOSPHORUS: Phosphorus: 3.4 mg/dL (ref 2.3–4.6)

## 2013-07-17 MED ORDER — IPRATROPIUM-ALBUTEROL 0.5-2.5 (3) MG/3ML IN SOLN: 3.0000 mL | Freq: Four times a day (QID) | RESPIRATORY_TRACT | Status: AC | PRN

## 2013-07-17 MED ORDER — AZITHROMYCIN 250 MG PO TABS: ORAL_TABLET | ORAL | Status: AC

## 2013-07-18 ENCOUNTER — Telehealth: Payer: Self-pay | Admitting: Family Medicine

## 2013-07-20 DIAGNOSIS — 419620001 Death: Secondary | SNOMED CT | POA: Insufficient documentation

## 2013-07-20 NOTE — ED Notes (Signed)
PATIENT MOVED TO POD C FOR FAMILY VIEWING. BODY BAG PLACED UNDER PT.

## 2013-07-20 NOTE — Progress Notes (Signed)
Inpatient RN [facility] 3 E Room 13-[facility] RN Visit-Karen Alford Highland RN  Related admission to [facility] diagnosis of  CHF. Pt is DNR code.   Pt alert & oriented sitting up in the recliner some dyspnea with conversation, O2 @ 3 L . Pt denied pain or discomfort. Pt's grandson, Antony Haste called during [place] spoke to Antony Haste to verify that [facility] had replaced pt's oxygen concentrator. Antony Haste stated that [facility] was meeting him by noon today, he verified that he would be bringing portable O2 tanks for Mr. Cogswell to use on the ride home.  Writer spoke with staff RN [place] and advsd her that pt's grandson would be coming to pick him up after the concentrator was in place.  [facility] home care team aware of plan.  No family present.   Patient's home medication list, discharge summary, OOF DNR and MOST form in place on shadow chart.   Please call [facility] @ [phone]-with any hospice needs.   Thank you. Tracey Harries, RN  [facility]  Hospice Liaison  [phone])

## 2013-07-20 NOTE — ED Notes (Signed)
Family at the bedside, waiting on other daughter to arrive from [place].
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
## 2013-07-20 NOTE — ED Notes (Signed)
Dr. Jodi Mourning and Dr. Wyvonnia Dusky at the bedside at time of arrival. Bedside u/s confirmed no cardiac movement, no corneal reflex, no gag reflex, no ROSC. Pt confirmed DOA by Dr. Wyvonnia Dusky.

## 2013-07-20 NOTE — ED Provider Notes (Signed)
I saw and evaluated the patient, reviewed the resident's note and I agree with the findings and plan. If applicable, I agree with the resident's interpretation of the EKG.  If applicable, I was present for critical portions of any procedures performed.  EMS called for fall, DNR hospice patient, discharged from [facility] yesterday for COPD exacerbation. Patient minimally responsive for EMS, initially had pulses but lost enroute. Unresponsive on arrival, pupils fixed and dilated, GCS 3, no heart sounds or lung sounds. No cardiac activity on monitor. Asystole. DOA. D/w Dr. Burnice Logan for Dr. Elease Hashimoto who accepts death certificate. D/w ME Jeanette Caprice who declines case.  Ezequiel Essex, MD 07/18/13 [phone]

## 2013-07-20 NOTE — ED Notes (Signed)
Per [facility], pt from [facility], called out at [place] for a fall. Arrived on scene at 1542, pt had a strong carotid of about 80 bpm. Started transport at 1556, pt became bradycardic, and then asystolic. Pt does have a DNR and a MOST form. TOD by [facility] called at 1558, arrived to [facility] ED at 1600. No CPR or medications given.

## 2013-07-20 NOTE — Progress Notes (Signed)
Pt is being discharged home with hospice. Yolanda Bonine is transporting the patient home. Pt has been provided with discharge instructions. RN went over instructions with the patient.

## 2013-07-20 DEATH — deceased

## 2013-07-26 ENCOUNTER — Ambulatory Visit: Payer: Medicare Other | Admitting: Family Medicine

## 2013-08-02 ENCOUNTER — Other Ambulatory Visit: Payer: Medicare Other

## 2013-08-07 NOTE — Telephone Encounter (Signed)
error 

## 2013-08-30 ENCOUNTER — Other Ambulatory Visit: Payer: Medicare Other

## 2013-09-06 ENCOUNTER — Ambulatory Visit: Payer: Medicare Other | Admitting: Cardiology

## 2013-09-27 ENCOUNTER — Other Ambulatory Visit: Payer: Medicare Other

## 2013-10-08 ENCOUNTER — Ambulatory Visit: Payer: Medicare Other | Admitting: Family Medicine

## 2013-10-25 ENCOUNTER — Other Ambulatory Visit: Payer: Medicare Other

## 2013-11-13 ENCOUNTER — Other Ambulatory Visit: Payer: Self-pay | Admitting: Pharmacist

## 2013-11-22 ENCOUNTER — Ambulatory Visit: Payer: Medicare Other | Admitting: Oncology

## 2013-11-22 ENCOUNTER — Other Ambulatory Visit: Payer: Medicare Other

## 2014-10-24 IMAGING — CR DG CHEST 2V
3 series · 3 of 3 positions shown · non-contrast
Comparison: 11/04/2011

CLINICAL DATA: Shortness of breath.  Cough.  History of COPD and
smoking.

CHEST - 2 VIEW

[x chest ap (1 of 2)]
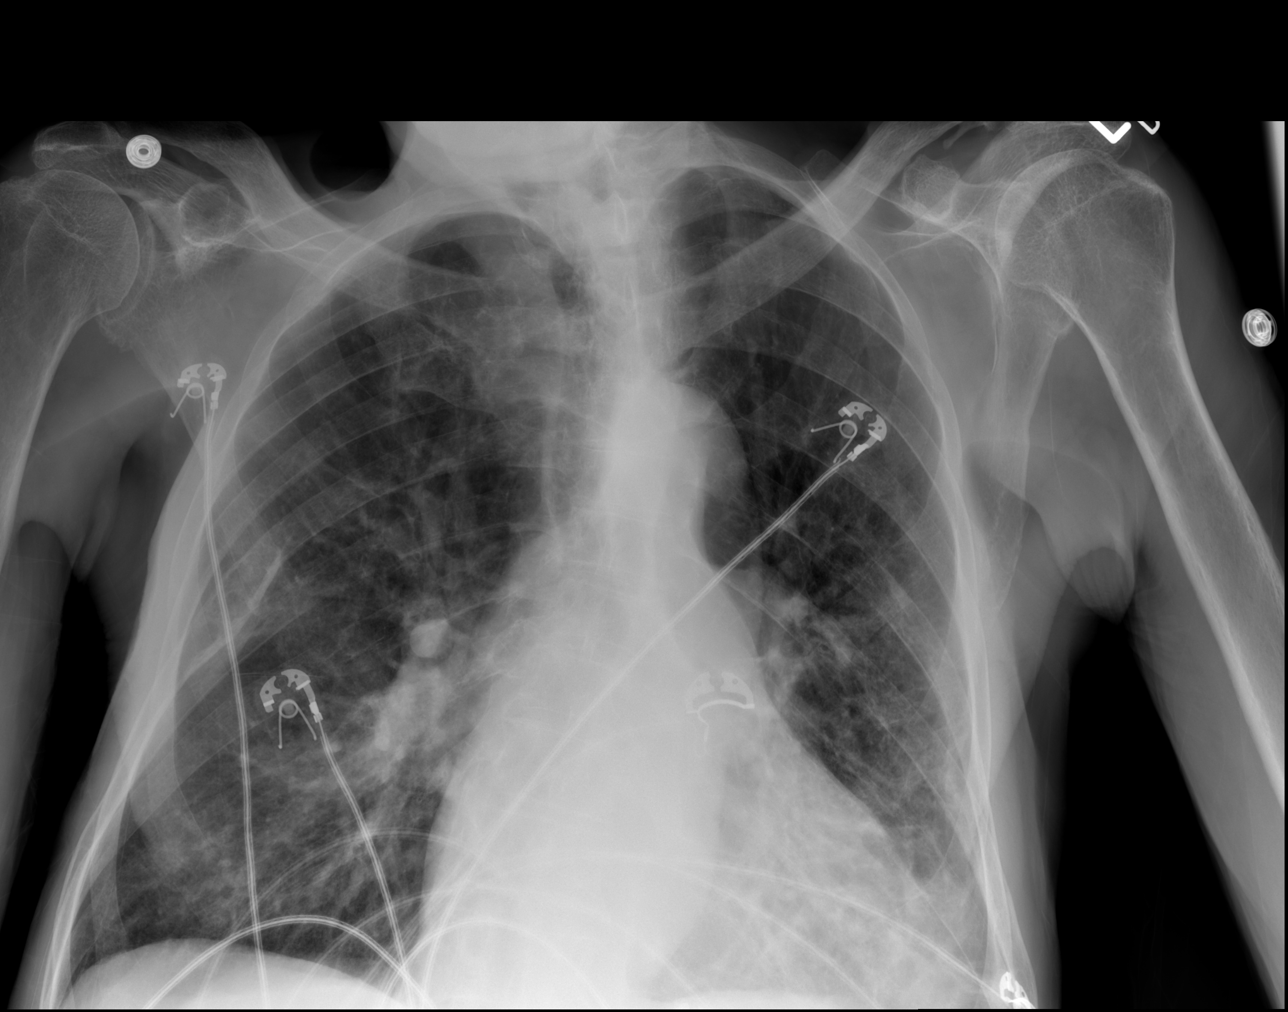

[x chest ap (2 of 2)]
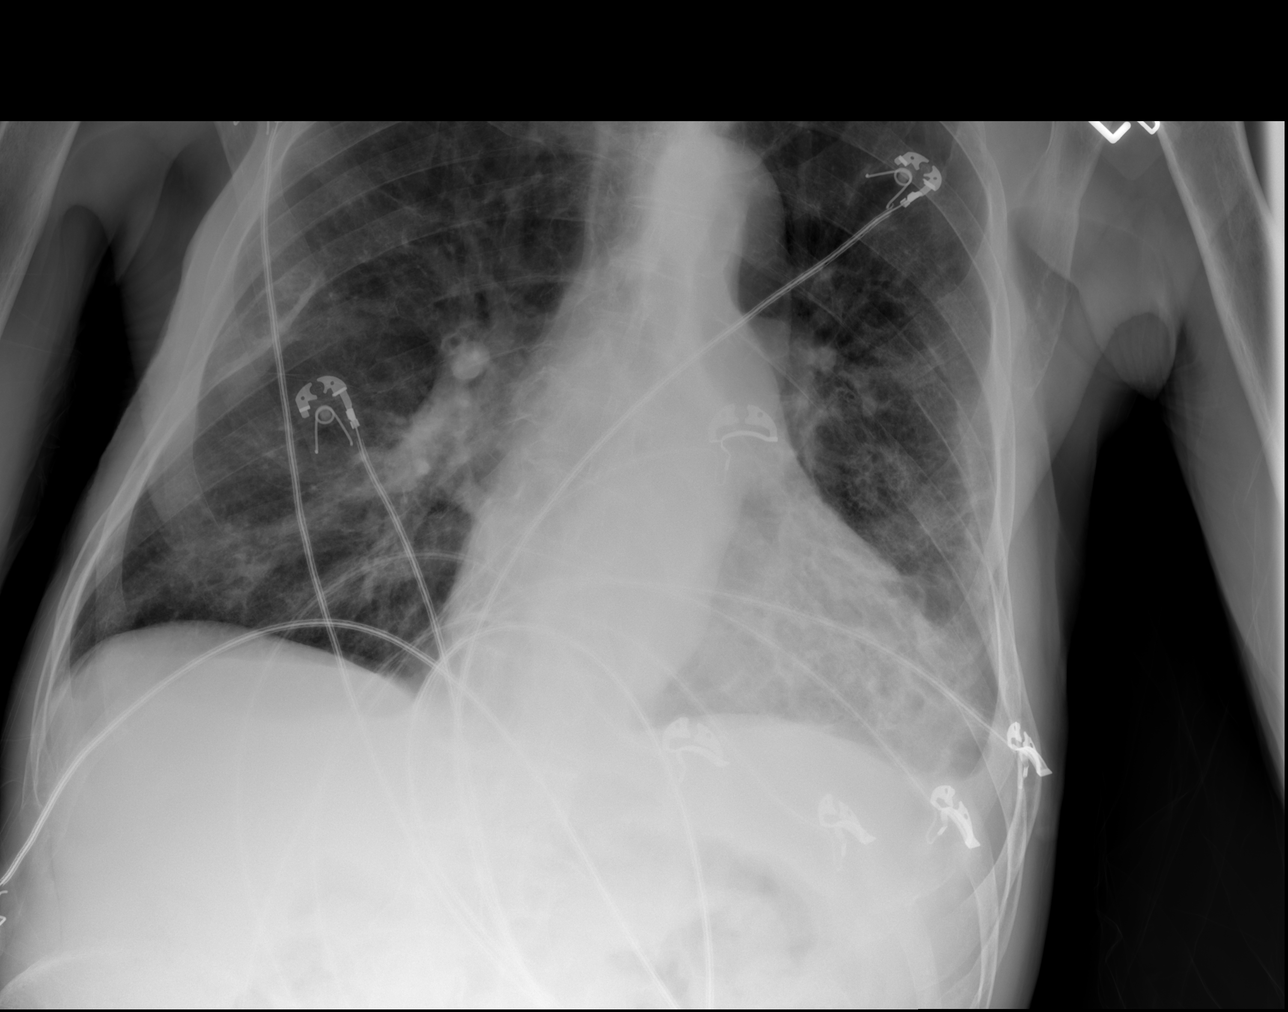

[w chest decub]
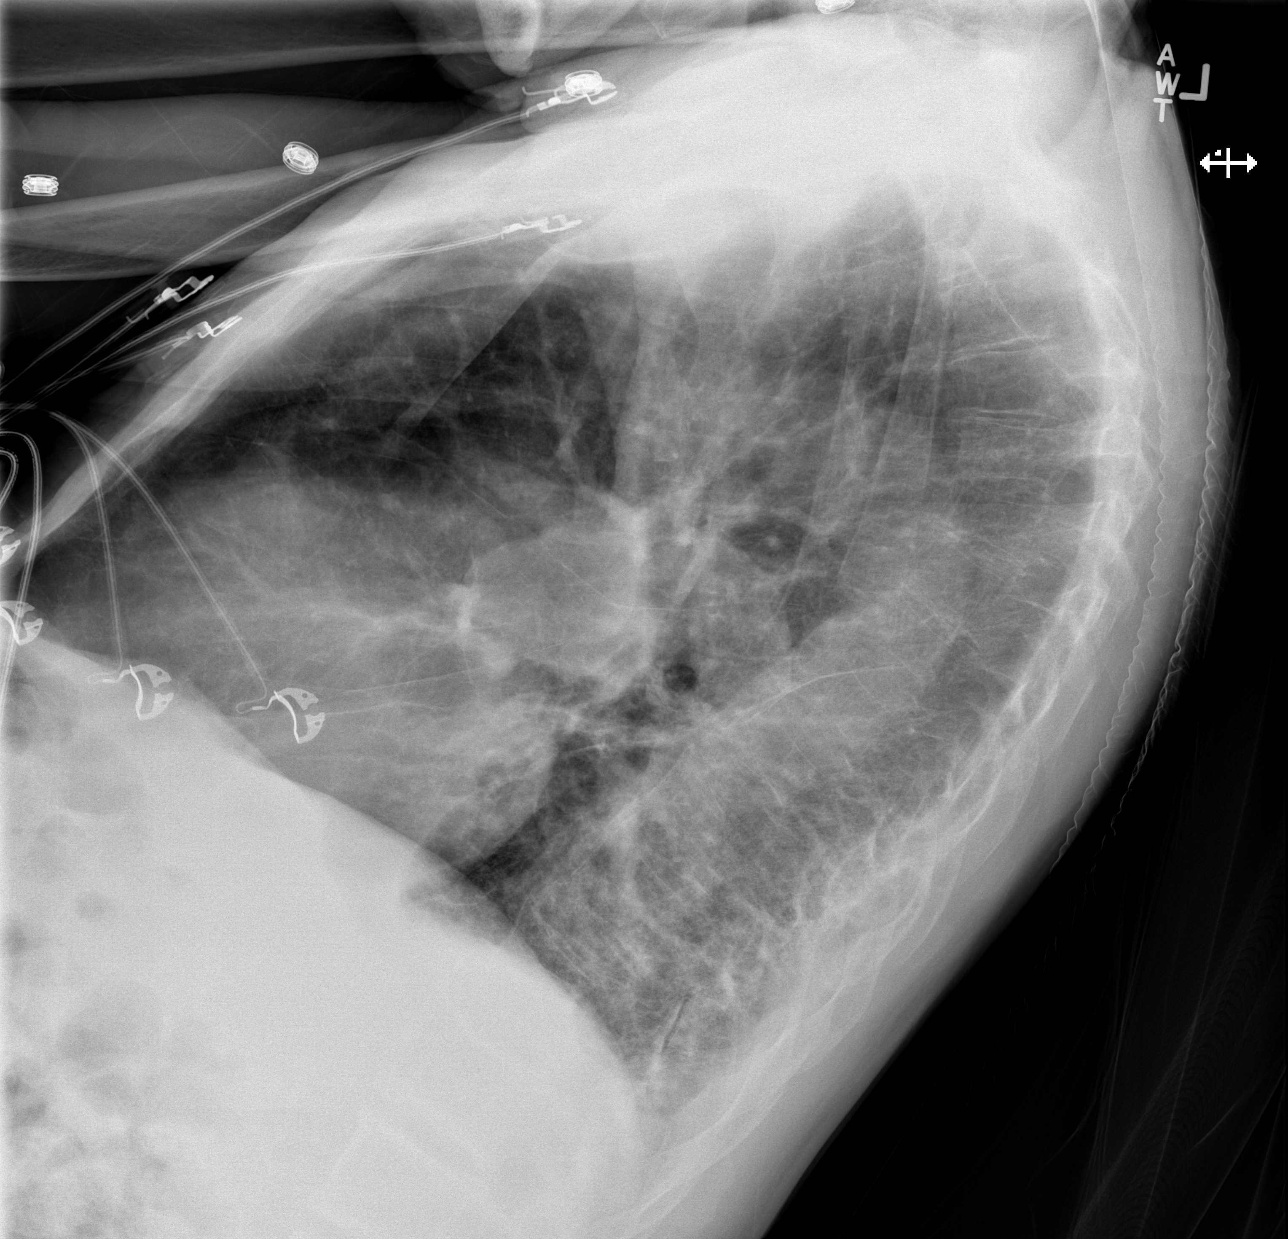

[3 of 3 positions shown; findings below may reference images not displayed]

FINDINGS: Lungs are hyperinflated.  There are patchy parenchymal
changes of chronic interstitial lung disease, not significantly
changed.  There is a left pleural effusion versus pleural
thickening, also stable in appearance.  There is perihilar
bronchitic change.
IMPRESSION: 1.  Cardiomegaly.
2.  Chronic lung disease.

## 2015-04-12 IMAGING — CR DG CHEST 1V PORT
1 series · 1 of 1 positions shown · non-contrast
Comparison: 07/06/2012; 04/25/2012; 11/04/2011; chest CT -
04/29/2010

CLINICAL DATA: Shortness of breath, history of COPD and CHF, former
smoker, history of fibrous dysplasia

PORTABLE CHEST - 1 VIEW

[AP]
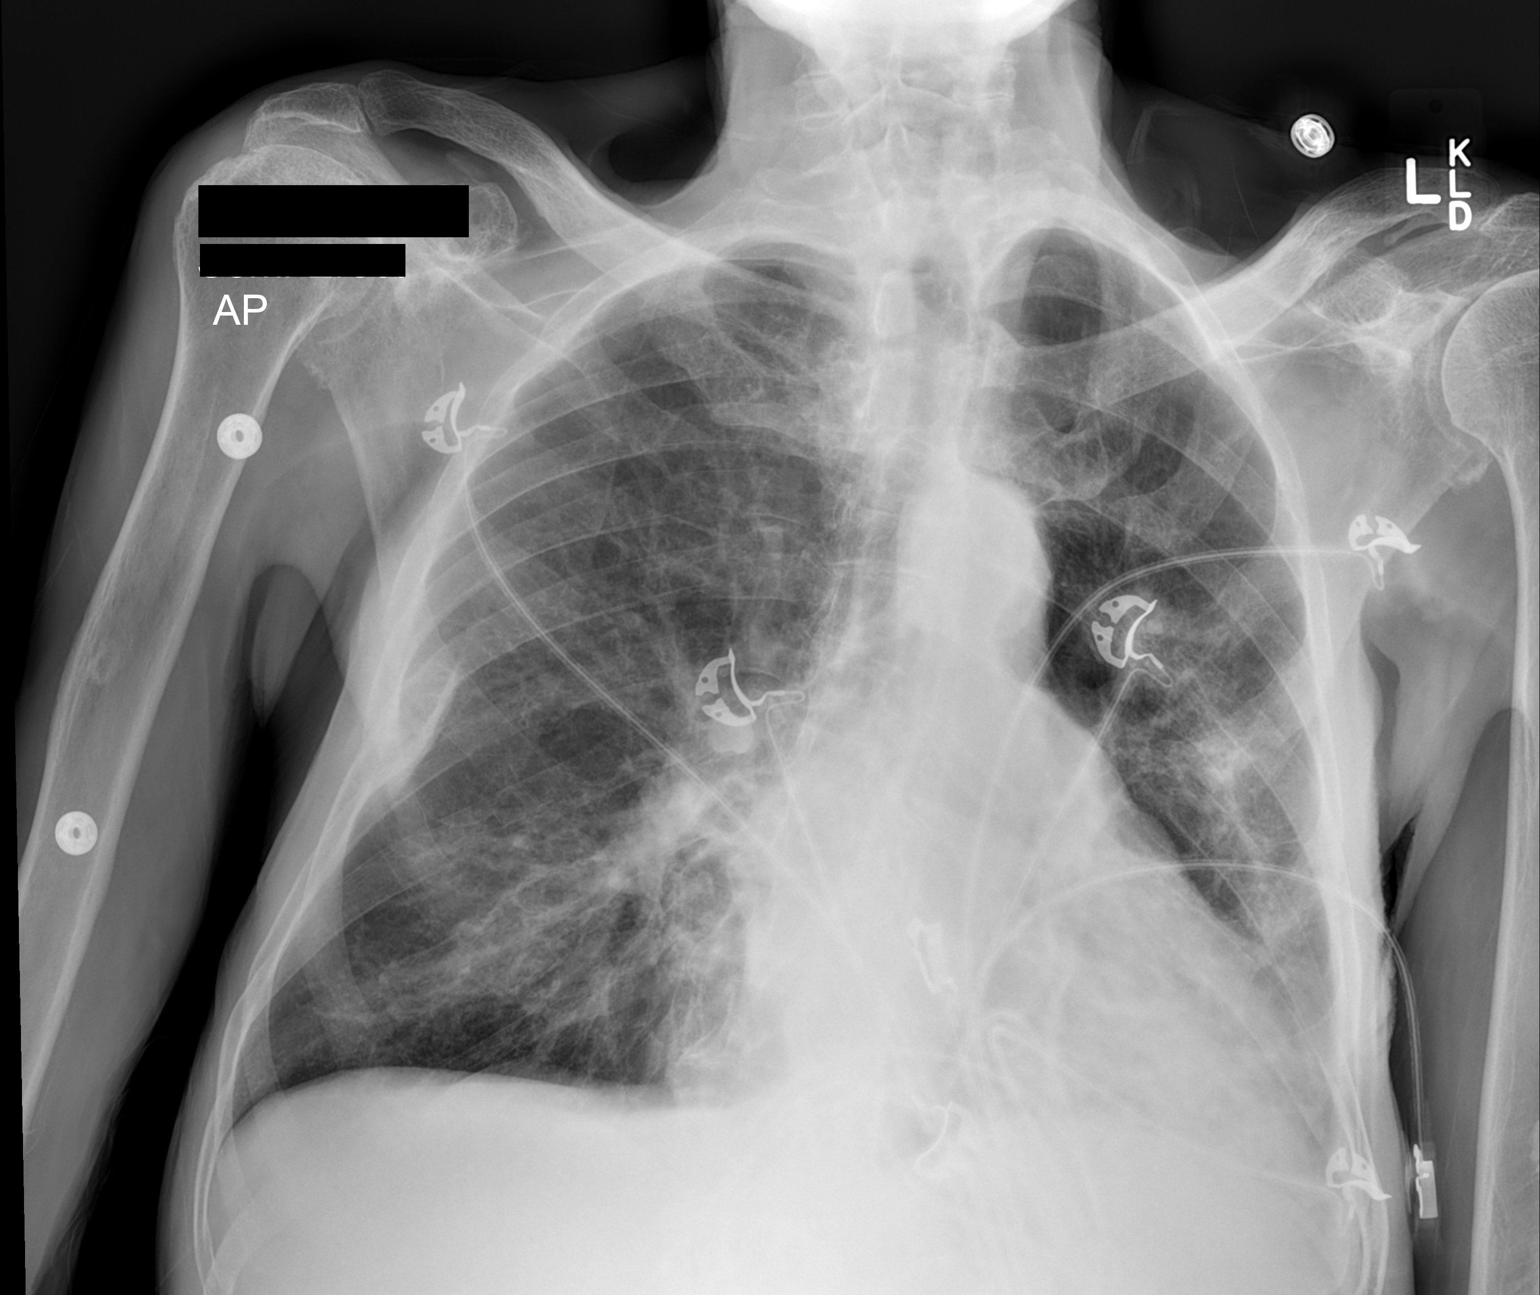

[1 of 1 positions shown; findings below may reference images not displayed]

FINDINGS: Grossly unchanged enlarged cardiac silhouette and mediastinal
contours.  The lungs remain hyperexpanded with flattening of
bilateral hemidiaphragms.

Interval development of heterogeneous air space opacities within
the left lower lung.  Additionally, there is a heterogeneous
slightly spiculated nodular opacity overlying the left mid lung.
Trace left-sided effusion is not excluded.  No definite
pneumothorax.

Grossly unchanged expansile lesion involving the lateral aspect of
the right seventh rib.  Possible additional peripherally sclerotic
lesion within the imaged right humerus.
IMPRESSION: 1.  Interval development of heterogeneous air space opacities
within the left lower lung with additional slightly nodular air
space opacity within the left mid lung.  Overall findings worrisome
for multifocal infection but given advanced underlying
emphysematous change, a follow-up chest radiograph in 4 to 6 weeks
after treatment is recommended to ensure complete resolution.

2.  Grossly unchanged expansile lesion involving the posterior
lateral aspect of the right seventh rib, similar when compared to
remote chest CT and compatible with provided history of fibrous
dysplasia.

## 2015-04-30 IMAGING — CR DG CHEST 1V PORT
1 series · 1 of 1 positions shown · non-contrast
Comparison: October 12, 2012.

CLINICAL DATA: Shortness of breath

PORTABLE CHEST - 1 VIEW

[AP]
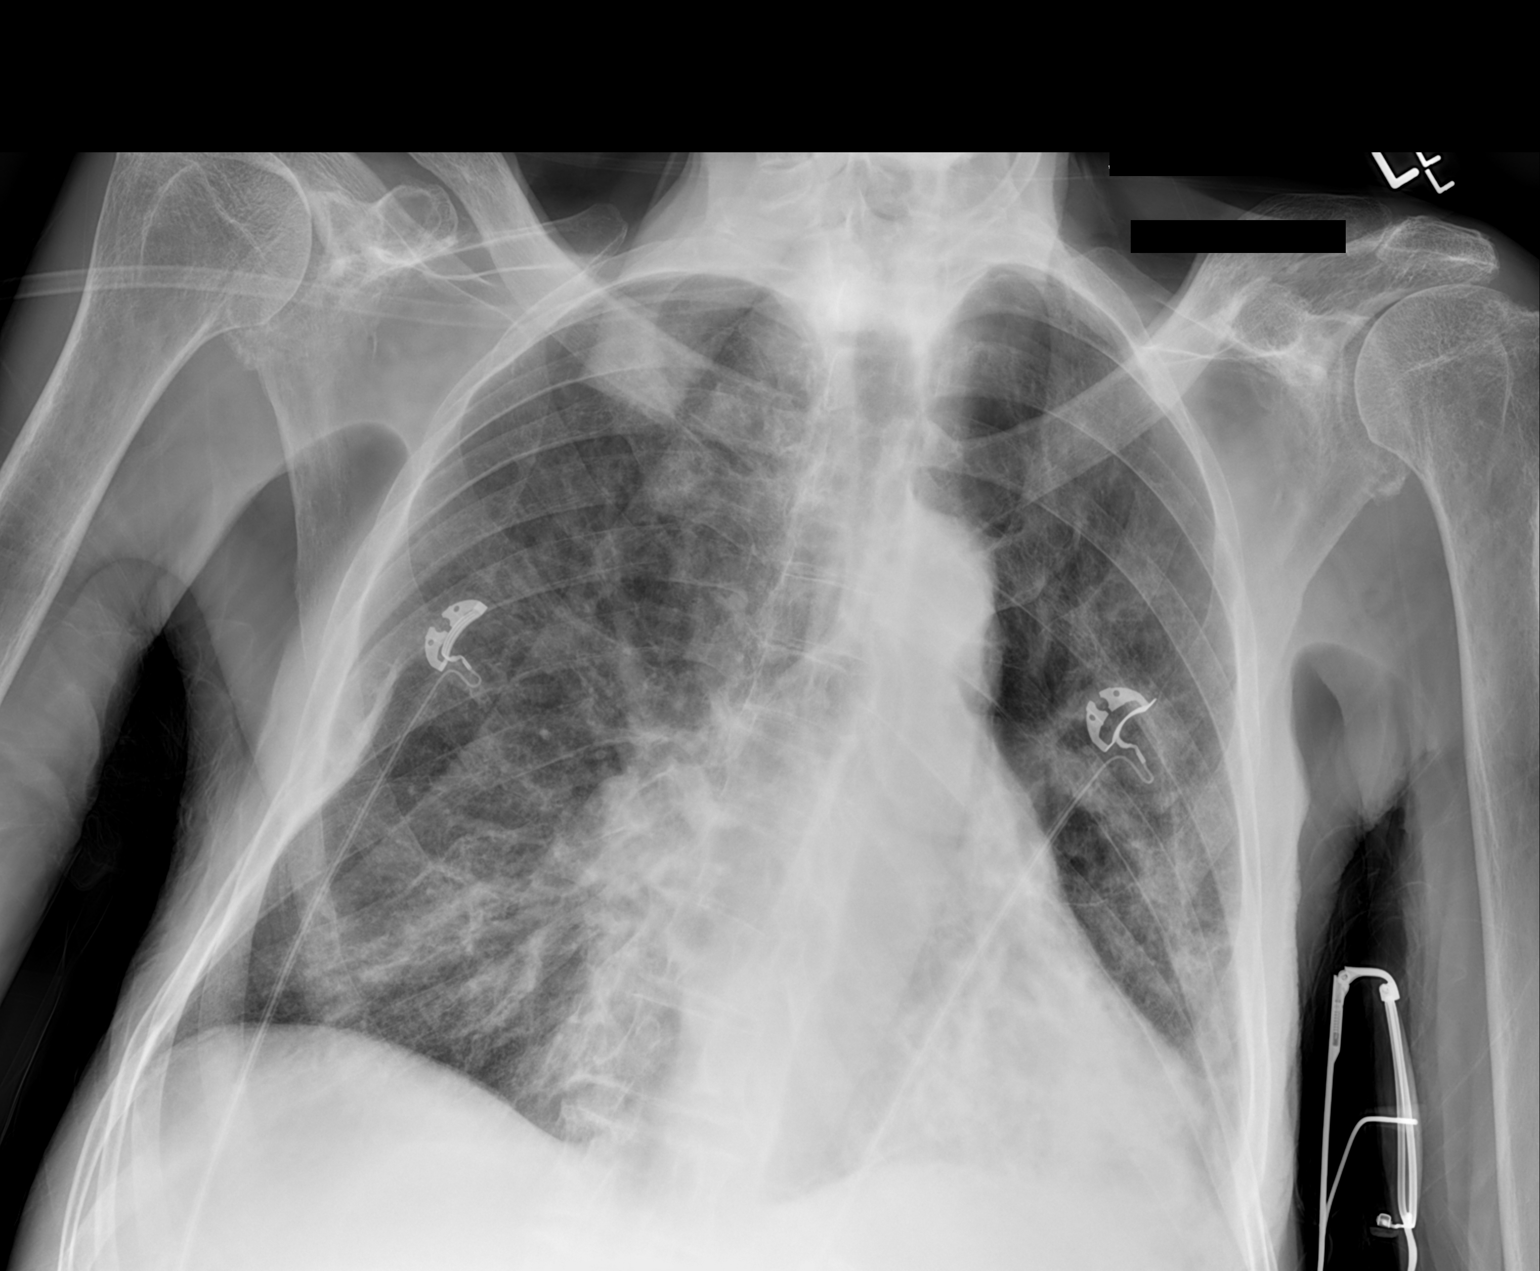

[1 of 1 positions shown; findings below may reference images not displayed]

FINDINGS: Stable cardiomegaly.  Stable expansile lesion seen in
lateral portion of right middle rib noted on prior exam.  No acute
pulmonary disease is noted.  Left mid lung nodular density noted on
prior exam is not well visualized currently.  No pleural effusion
or pneumothorax is noted.
IMPRESSION: No acute cardiopulmonary abnormality seen.  Left midlung nodular
opacity seen on prior exam is not well visualized currently.
